# Patient Record
Sex: Female | Born: 1995 | Race: White | Hispanic: No | Marital: Married | Smoking: Never smoker
Health system: Southern US, Community
[De-identification: ages and names within clinical notes are randomized; demographics above are authoritative.]

## PROBLEM LIST (undated history)

## (undated) DIAGNOSIS — K8020 Calculus of gallbladder without cholecystitis without obstruction: Secondary | ICD-10-CM

## (undated) DIAGNOSIS — I10 Essential (primary) hypertension: Secondary | ICD-10-CM

## (undated) DIAGNOSIS — O139 Gestational [pregnancy-induced] hypertension without significant proteinuria, unspecified trimester: Secondary | ICD-10-CM

## (undated) HISTORY — PX: NO PAST SURGERIES: SHX2092

## (undated) HISTORY — DX: Calculus of gallbladder without cholecystitis without obstruction: K80.20

---
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
## 2019-09-05 ENCOUNTER — Other Ambulatory Visit: Payer: Self-pay

## 2019-09-05 ENCOUNTER — Ambulatory Visit (INDEPENDENT_AMBULATORY_CARE_PROVIDER_SITE_OTHER): Payer: Medicaid Other

## 2019-09-05 VITALS — BP 127/85 | HR 97 | Ht 63.5 in | Wt 179.0 lb

## 2019-09-05 DIAGNOSIS — N912 Amenorrhea, unspecified: Secondary | ICD-10-CM

## 2019-09-05 DIAGNOSIS — Z3491 Encounter for supervision of normal pregnancy, unspecified, first trimester: Secondary | ICD-10-CM

## 2019-09-05 DIAGNOSIS — Z3687 Encounter for antenatal screening for uncertain dates: Secondary | ICD-10-CM

## 2019-09-05 DIAGNOSIS — O3680X0 Pregnancy with inconclusive fetal viability, not applicable or unspecified: Secondary | ICD-10-CM

## 2019-09-05 DIAGNOSIS — Z3403 Encounter for supervision of normal first pregnancy, third trimester: Secondary | ICD-10-CM | POA: Insufficient documentation

## 2019-09-05 LAB — POCT URINE PREGNANCY: Preg Test, Ur: POSITIVE — AB

## 2019-09-05 MED ORDER — VITAFOL GUMMIES 3.33-0.333-34.8 MG PO CHEW: 3.0000 | CHEWABLE_TABLET | Freq: Every day | ORAL | 11 refills | Status: DC

## 2019-09-05 MED ORDER — BLOOD PRESSURE MONITOR KIT: 1.0000 | PACK | 0 refills | Status: DC

## 2019-09-05 NOTE — Progress Notes (Signed)
Patient was assessed and managed by nursing staff during this encounter. I have reviewed the chart and agree with the documentation and plan. I have also made any necessary editorial changes.  Griffin Basil, MD 09/05/2019 3:27 PM

## 2019-09-05 NOTE — Progress Notes (Signed)
.   PRENATAL INTAKE SUMMARY  Ms. Rudi Rummage presents today New OB Nurse Interview.  OB History   No obstetric history on file.    I have reviewed the patient's medical, obstetrical, social, and family histories, medications, and available lab results.  SUBJECTIVE She has no unusual complaints  OBJECTIVE Initial Physical Exam (New OB)  GENERAL APPEARANCE: alert, well appearing   ASSESSMENT Normal pregnancy  PLAN Prenatal care New OB labs will be completed at NEW OB visit Baby Scripts Ordered Blood Pressure Kit Ordered PHQ9 score: 2  DATING AND VIABILITY SONOGRAM   Ariadne Rissmiller is a 24 y.o. year old G1P0 with LMP Patient's last menstrual period was 06/27/2019. which would correlate to  51w4dweeks gestation.  She has regular menstrual cycles.   She is here today for a confirmatory initial sonogram.    GESTATION: 139w4dINGLETON     FETAL ACTIVITY:           Heart rate         171          The fetus is active.  Gestational criteria: Estimated Date of Delivery: 03/29/20 by early ultrasound now at 1017w4drevious Scans:0      CROWN RUMP LENGTH              10w22w4d                                                                           AVERAGE EGA(BY THIS SCAN):  10w474w4dKING EDD( early ultrasound ):  03/29/20     TECHNICIAN COMMENTS:  Single Live IUP at 31w4d14w4dL. [id number]   A copy of this report including all images has been saved and backed up to a second source for retrieval if needed. All measures and details of the anatomical scan, placentation, fluid volume and pelvic anatomy are contained in that report.  Olyn Landstrom Lucianne Lei021 2:38 PM

## 2019-09-13 ENCOUNTER — Other Ambulatory Visit: Payer: Self-pay

## 2019-09-13 ENCOUNTER — Ambulatory Visit (INDEPENDENT_AMBULATORY_CARE_PROVIDER_SITE_OTHER): Payer: Medicaid Other | Admitting: Obstetrics and Gynecology

## 2019-09-13 ENCOUNTER — Encounter: Payer: Self-pay | Admitting: Obstetrics and Gynecology

## 2019-09-13 ENCOUNTER — Other Ambulatory Visit (HOSPITAL_COMMUNITY)
Admission: RE | Admit: 2019-09-13 | Discharge: 2019-09-13 | Disposition: A | Discharge status: Home / Self Care | Payer: Medicaid Other | Source: Ambulatory Visit | Attending: Obstetrics and Gynecology | Admitting: Obstetrics and Gynecology

## 2019-09-13 VITALS — BP 119/78 | HR 95 | Wt 176.6 lb

## 2019-09-13 DIAGNOSIS — Z124 Encounter for screening for malignant neoplasm of cervix: Secondary | ICD-10-CM

## 2019-09-13 DIAGNOSIS — Z3481 Encounter for supervision of other normal pregnancy, first trimester: Secondary | ICD-10-CM

## 2019-09-13 DIAGNOSIS — Z3491 Encounter for supervision of normal pregnancy, unspecified, first trimester: Secondary | ICD-10-CM

## 2019-09-13 DIAGNOSIS — Z7189 Other specified counseling: Secondary | ICD-10-CM

## 2019-09-13 DIAGNOSIS — Z3A11 11 weeks gestation of pregnancy: Secondary | ICD-10-CM

## 2019-09-13 NOTE — Patient Instructions (Signed)

## 2019-09-13 NOTE — Progress Notes (Signed)
Pt is here for initial OB visit. EDD 03/29/20.

## 2019-09-13 NOTE — Progress Notes (Signed)
  Subjective:    Catherine Paul is a G1P0 [redacted]w[redacted]d being seen today for her first obstetrical visit.  Her obstetrical history is unremarkable. Patient does intend to breast feed. Pregnancy history fully reviewed.  Patient reports no complaints.Feeling well. Just a little tired. FOB involved. Lives in [place]. Tries to eat well.    Vitals:   09/13/19 1314 09/13/19 1320  BP: 136/84 119/78  Pulse: 94 95  Weight: 176 lb 9.6 oz (80.1 kg)     HISTORY: OB History  Gravida Para Term Preterm AB Living  1            SAB TAB Ectopic Multiple Live Births               # Outcome Date GA Lbr Len/2nd Weight Sex Delivery Anes PTL Lv  1 Current            History reviewed. No pertinent past medical history. History reviewed. No pertinent surgical history. Family History  Problem Relation Age of Onset  . Diabetes Maternal Grandmother   . Hypertension Maternal Grandmother   . Hypertension Maternal Grandfather   . Stroke Maternal Grandfather   . Hypertension Paternal Grandmother      Exam    Uterus:     Pelvic Exam:    Perineum: No Hemorrhoids   Vulva: normal   Vagina:  normal mucosa   pH: n/a   Cervix: nulliparous appearance   Adnexa: normal adnexa      System: Breast:  normal appearance, no masses or tenderness   Skin: normal coloration and turgor, no rashes    Neurologic: normal   Extremities: normal strength, tone, and muscle mass, no deformities   HEENT PERRLA and sclera clear, anicteric   Mouth/Teeth mucous membranes moist, pharynx normal without lesions   Neck supple   Cardiovascular: regular rate and rhythm   Respiratory:  appears well, vitals normal, no respiratory distress, acyanotic, normal RR, ear and throat exam is normal, neck free of mass or lymphadenopathy, chest clear, no wheezing, crepitations, rhonchi, normal symmetric air entry   Abdomen: soft, non-tender; bowel sounds normal; no masses,  no organomegaly   Urinary: urethral meatus normal      Assessment:     Pregnancy: G1P0 Patient Active Problem List   Diagnosis Date Noted  . Encounter for supervision of normal pregnancy, unspecified, first trimester 09/05/2019        Plan:    Supervision of Normal Pregnancy  Initial labs drawn. Pap performed. Prenatal vitamins. Problem list reviewed and updated. Genetic Screening discussed: requested.   Follow up in 4 weeks.  The patient was counseled on the potential benefits and lack of known risks of COVID vaccination, during pregnancy and breastfeeding, on today's visit. The patient's questions and concerns were addressed today including concerns over lack of data on first trimester vaccination. The patient is still unsure of her decision for vaccination.      Janet Berlin 09/13/2019

## 2019-09-14 LAB — CBC/D/PLT+RPR+RH+ABO+RUB AB...
Antibody Screen: NEGATIVE
Basophils Absolute: 0 10*3/uL (ref 0.0–0.2)
Basos: 1 %
EOS (ABSOLUTE): 0.1 10*3/uL (ref 0.0–0.4)
Eos: 1 %
HCV Ab: <0.1 s/co ratio (ref 0.0–0.9)
HIV Screen 4th Generation wRfx: NONREACTIVE
Hematocrit: 41.9 % (ref 34.0–46.6)
Hemoglobin: 14.5 g/dL (ref 11.1–15.9)
Hepatitis B Surface Ag: NEGATIVE
Immature Grans (Abs): 0 10*3/uL (ref 0.0–0.1)
Immature Granulocytes: 0 %
Lymphocytes Absolute: 1.8 10*3/uL (ref 0.7–3.1)
Lymphs: 20 %
MCH: 30 pg (ref 26.6–33.0)
MCHC: 34.6 g/dL (ref 31.5–35.7)
MCV: 87 fL (ref 79–97)
Monocytes Absolute: 0.5 10*3/uL (ref 0.1–0.9)
Monocytes: 6 %
Neutrophils Absolute: 6.2 10*3/uL (ref 1.4–7.0)
Neutrophils: 72 %
Platelets: 263 10*3/uL (ref 150–450)
RBC: 4.84 x10E6/uL (ref 3.77–5.28)
RDW: 13.1 % (ref 11.7–15.4)
RPR Ser Ql: NONREACTIVE
Rh Factor: POSITIVE
Rubella Antibodies, IGG: 8.46 index (ref >0.99)
WBC: 8.6 10*3/uL (ref 3.4–10.8)

## 2019-09-14 LAB — HCV INTERPRETATION

## 2019-09-14 LAB — CERVICOVAGINAL ANCILLARY ONLY
Bacterial Vaginitis (gardnerella): NEGATIVE
Candida Glabrata: NEGATIVE
Candida Vaginitis: NEGATIVE
Chlamydia: NEGATIVE
Comment: NEGATIVE
Comment: NEGATIVE
Comment: NEGATIVE
Comment: NEGATIVE
Comment: NEGATIVE
Comment: NORMAL
Neisseria Gonorrhea: NEGATIVE
Trichomonas: NEGATIVE

## 2019-09-14 LAB — HEMOGLOBIN A1C
Est. average glucose Bld gHb Est-mCnc: 105 mg/dL
Hgb A1c MFr Bld: 5.3 % (ref 4.8–5.6)

## 2019-09-14 LAB — CYTOLOGY - PAP: Diagnosis: NEGATIVE

## 2019-09-15 LAB — CULTURE, OB URINE

## 2019-09-15 LAB — URINE CULTURE, OB REFLEX

## 2019-09-19 ENCOUNTER — Encounter: Payer: Self-pay | Admitting: Obstetrics and Gynecology

## 2019-09-21 ENCOUNTER — Encounter: Payer: Self-pay | Admitting: Obstetrics and Gynecology

## 2019-10-11 ENCOUNTER — Ambulatory Visit (INDEPENDENT_AMBULATORY_CARE_PROVIDER_SITE_OTHER): Payer: Medicaid Other

## 2019-10-11 ENCOUNTER — Other Ambulatory Visit: Payer: Self-pay

## 2019-10-11 VITALS — BP 119/82 | HR 114 | Wt 174.2 lb

## 2019-10-11 DIAGNOSIS — O99891 Other specified diseases and conditions complicating pregnancy: Secondary | ICD-10-CM

## 2019-10-11 DIAGNOSIS — Z3A15 15 weeks gestation of pregnancy: Secondary | ICD-10-CM

## 2019-10-11 DIAGNOSIS — O26892 Other specified pregnancy related conditions, second trimester: Secondary | ICD-10-CM | POA: Diagnosis not present

## 2019-10-11 DIAGNOSIS — R300 Dysuria: Secondary | ICD-10-CM

## 2019-10-11 DIAGNOSIS — Z3481 Encounter for supervision of other normal pregnancy, first trimester: Secondary | ICD-10-CM

## 2019-10-11 DIAGNOSIS — M549 Dorsalgia, unspecified: Secondary | ICD-10-CM

## 2019-10-11 LAB — POCT URINALYSIS DIPSTICK
Bilirubin, UA: NEGATIVE
Blood, UA: NEGATIVE
Glucose, UA: NEGATIVE
Ketones, UA: NEGATIVE
Nitrite, UA: NEGATIVE
Protein, UA: NEGATIVE
Spec Grav, UA: 1.01 (ref 1.010–1.025)
Urobilinogen, UA: 0.2 E.U./dL
pH, UA: 6 (ref 5.0–8.0)

## 2019-10-11 MED ORDER — MISC. DEVICES MISC: 0 refills | Status: DC

## 2019-10-11 NOTE — Progress Notes (Addendum)
Pt needs maternity belt

## 2019-10-11 NOTE — Patient Instructions (Signed)
Back Pain in Pregnancy Back pain during pregnancy is common. Back pain may be caused by several factors that are related to changes during your pregnancy. Follow these instructions at home: Managing pain, stiffness, and swelling      If directed, for sudden (acute) back pain, put ice on the painful area. ? Put ice in a plastic bag. ? Place a towel between your skin and the bag. ? Leave the ice on for 20 minutes, 2-3 times per day.  If directed, apply heat to the affected area before you exercise. Use the heat source that your health care provider recommends, such as a moist heat pack or a heating pad. ? Place a towel between your skin and the heat source. ? Leave the heat on for 20-30 minutes. ? Remove the heat if your skin turns bright red. This is especially important if you are unable to feel pain, heat, or cold. You may have a greater risk of getting burned.  If directed, massage the affected area. Activity  Exercise as told by your health care provider. Gentle exercise is the best way to prevent or manage back pain.  Listen to your body when lifting. If lifting hurts, ask for help or bend your knees. This uses your leg muscles instead of your back muscles.  Squat down when picking up something from the floor. Do not bend over.  Only use bed rest for short periods as told by your health care provider. Bed rest should only be used for the most severe episodes of back pain. Standing, sitting, and lying down  Do not stand in one place for long periods of time.  Use good posture when sitting. Make sure your head rests over your shoulders and is not hanging forward. Use a pillow on your lower back if necessary.  Try sleeping on your side, preferably the left side, with a pregnancy support pillow or 1-2 regular pillows between your legs. ? If you have back pain after a night's rest, your bed may be too soft. ? A firm mattress may provide more support for your back during  pregnancy. General instructions  Do not wear high heels.  Eat a healthy diet. Try to gain weight within your health care provider's recommendations.  Use a maternity girdle, elastic sling, or back brace as told by your health care provider.  Take over-the-counter and prescription medicines only as told by your health care provider.  Work with a physical therapist or massage therapist to find ways to manage back pain. Acupuncture or massage therapy may be helpful.  Keep all follow-up visits as told by your health care provider. This is important. Contact a health care provider if:  Your back pain interferes with your daily activities.  You have increasing pain in other parts of your body. Get help right away if:  You develop numbness, tingling, weakness, or problems with the use of your arms or legs.  You develop severe back pain that is not controlled with medicine.  You have a change in bowel or bladder control.  You develop shortness of breath, dizziness, or you faint.  You develop nausea, vomiting, or sweating.  You have back pain that is a rhythmic, cramping pain similar to labor pains. Labor pain is usually 1-2 minutes apart, lasts for about 1 minute, and involves a bearing down feeling or pressure in your pelvis.  You have back pain and your water breaks or you have vaginal bleeding.  You have back pain or numbness  that travels down your leg. °· Your back pain developed after you fell. °· You develop pain on one side of your back. °· You see blood in your urine. °· You develop skin blisters in the area of your back pain. °Summary °· Back pain may be caused by several factors that are related to changes during your pregnancy. °· Follow instructions as told by your health care provider for managing pain, stiffness, and swelling. °· Exercise as told by your health care provider. Gentle exercise is the best way to prevent or manage back pain. °· Take over-the-counter and  prescription medicines only as told by your health care provider. °· Keep all follow-up visits as told by your health care provider. This is important. °This information is not intended to replace advice given to you by your health care provider. Make sure you discuss any questions you have with your health care provider. °Document Revised: 05/09/2018 Document Reviewed: 07/06/2017 °Elsevier Patient Education © 2020 [employer] ° °

## 2019-10-11 NOTE — Progress Notes (Signed)
LOW-RISK PREGNANCY OFFICE VISIT  Patient name: Nisreen Guise MRN [id number]  Date of birth: [birth year] Chief Complaint:   Routine Prenatal Visit  Subjective:   Fantasy Donald is a 24 y.o. G1P0 female at [redacted]w[redacted]d with an Estimated Date of Delivery: 03/29/20 being seen today for ongoing management of a low-risk pregnancy aeb has Encounter for supervision of normal pregnancy, unspecified, first trimester on their problem list.  Patient presents today with complaint of hip and back pain. Patient states pain started Friday after work despite not taking on any abnormal activities.  Patient states each day the pain has gotten progressively better and now is more aching.  She states she is not taking any medication for her symptoms and "doesn't really like to take medication anyway." Patient states she has not done any warm or cold compresses, but finds warm showers help.  Patient denies vaginal concerns including abnormal discharge, leaking of fluid, and bleeding. However, patient reports burning with urination on Monday and nothing since.  She reports drinking 64 ounces of water daily.  Contractions: Not present. Vag. Bleeding: None.  Movement: Present.  Reviewed past medical,surgical, social, obstetrical and family history as well as problem list, medications and allergies.  Objective   Vitals:   10/11/19 1033  BP: 119/82  Pulse: (!) 114  Weight: 174 lb 3.2 oz (79 kg)  Body mass index is 30.37 kg/m.  Total Weight Gain:3.2 oz (0.091 kg)         Physical Examination:   General appearance: Well appearing, and in no distress  Mental status: Alert, oriented to person, place, and time  Skin: Warm & dry  Cardiovascular: Normal heart rate noted  Respiratory: Normal respiratory effort, no distress  Abdomen: Soft, gravid, nontender, AGA   Pelvic: Cervical exam deferred           Extremities: Edema: None  Fetal Status: Fetal Heart Rate (bpm): 160  Movement: Present   Results for orders placed or  performed in visit on 10/11/19 (from the past 24 hour(s))  POCT Urinalysis Dipstick   Collection Time: 10/11/19 11:12 AM  Result Value Ref Range   Color, UA Yellow    Clarity, UA slightly cloudy    Glucose, UA Negative Negative   Bilirubin, UA Negative    Ketones, UA Negative    Spec Grav, UA 1.010 1.010 - 1.025   Blood, UA Negative    pH, UA 6.0 5.0 - 8.0   Protein, UA Negative Negative   Urobilinogen, UA 0.2 0.2 or 1.0 E.U./dL   Nitrite, UA Negative    Leukocytes, UA Small (1+) (A) Negative   Appearance     Odor      Assessment & Plan:  Low-risk pregnancy of a 24 y.o., G1P0 at [redacted]w[redacted]d with an Estimated Date of Delivery: 03/29/20   1. [redacted] weeks gestation of pregnancy -AFP today  2. Encounter for supervision of other normal pregnancy in first trimester -Anticipatory guidance for upcoming appts. -Plan for VV in 4-6 weeks.  -Encouraged to increase H2O intake to about 80ounces daily.   3. Back pain affecting pregnancy in second trimester -Discussed usage of tylenol and/or warm compresses for back pain. -Reviewed initiation of belly support band/belt for usage at work. -Rx given.  4. Dysuria during pregnancy in second trimester -Informed of negative urine dip, less + leuks. -Will send for culture.  -Encouraged to monitor symptoms and report as appropriate.      Meds: No orders of the defined types were placed in this encounter.  Labs/procedures today:   Lab Orders     Urine Culture     AFP, Serum, Open Spina Bifida     POCT Urinalysis Dipstick   Reviewed: Preterm labor symptoms and general obstetric precautions including but not limited to vaginal bleeding, contractions, leaking of fluid and fetal movement were reviewed in detail with the patient.  All questions were answered.  Follow-up: Return in about 5 weeks (around 11/15/2019) for Virtual LR-ROB.  Orders Placed This Encounter  Procedures  . Urine Culture  . AFP, Serum, Open Spina Bifida  . POCT Urinalysis  Dipstick   Maryann Conners MSN, CNM 10/11/2019

## 2019-10-11 NOTE — Progress Notes (Signed)
Patient presents for ROB. Patient complains of having hip and lower back pain. She also complains of having intermittent burning with urination.

## 2019-10-13 LAB — AFP, SERUM, OPEN SPINA BIFIDA
AFP MoM: 1.45
AFP Value: 39.7 ng/mL
Gest. Age on Collection Date: 15 weeks
Maternal Age At EDD: 23.3 yr
OSBR Risk 1 IN: 3133
Test Results:: NEGATIVE
Weight: 174 [lb_av]

## 2019-10-13 LAB — URINE CULTURE: Organism ID, Bacteria: NO GROWTH

## 2019-10-16 ENCOUNTER — Telehealth: Payer: Self-pay

## 2019-10-16 ENCOUNTER — Emergency Department (HOSPITAL_BASED_OUTPATIENT_CLINIC_OR_DEPARTMENT_OTHER)
Admission: EM | Admit: 2019-10-16 | Discharge: 2019-10-16 | Disposition: A | Discharge status: Home / Self Care | Payer: Medicaid Other | Attending: Emergency Medicine | Admitting: Emergency Medicine

## 2019-10-16 ENCOUNTER — Other Ambulatory Visit: Payer: Self-pay

## 2019-10-16 ENCOUNTER — Encounter (HOSPITAL_BASED_OUTPATIENT_CLINIC_OR_DEPARTMENT_OTHER): Payer: Self-pay

## 2019-10-16 DIAGNOSIS — Z3A16 16 weeks gestation of pregnancy: Secondary | ICD-10-CM | POA: Insufficient documentation

## 2019-10-16 DIAGNOSIS — O98512 Other viral diseases complicating pregnancy, second trimester: Secondary | ICD-10-CM | POA: Diagnosis not present

## 2019-10-16 DIAGNOSIS — Z3492 Encounter for supervision of normal pregnancy, unspecified, second trimester: Secondary | ICD-10-CM

## 2019-10-16 DIAGNOSIS — Z79899 Other long term (current) drug therapy: Secondary | ICD-10-CM | POA: Diagnosis not present

## 2019-10-16 DIAGNOSIS — M7918 Myalgia, other site: Secondary | ICD-10-CM | POA: Diagnosis not present

## 2019-10-16 DIAGNOSIS — U071 COVID-19: Secondary | ICD-10-CM | POA: Diagnosis not present

## 2019-10-16 LAB — SARS CORONAVIRUS 2 BY RT PCR (HOSPITAL ORDER, PERFORMED IN ~~LOC~~ HOSPITAL LAB): SARS Coronavirus 2: POSITIVE — AB

## 2019-10-16 MED ORDER — ACETAMINOPHEN 325 MG PO TABS
ORAL_TABLET | ORAL | Status: AC
  Filled 2019-10-16: qty 2

## 2019-10-16 MED ORDER — ACETAMINOPHEN 325 MG PO TABS
650.0000 mg | ORAL_TABLET | Freq: Once | ORAL | Status: AC
  Administered 2019-10-16: 650 mg via ORAL

## 2019-10-16 NOTE — ED Notes (Signed)
ED Provider at bedside. 

## 2019-10-16 NOTE — Discharge Instructions (Addendum)
Home to rest. Follow up with your OB.  Tylenol and Mucenx as needed as directed for fever and symptom management.  Discuss antibody infusion with your OB, THEN call [phone] to discuss scheduling with the infusion clinic.

## 2019-10-16 NOTE — ED Provider Notes (Signed)
[facility] EMERGENCY DEPARTMENT Provider Note   CSN: [id number] Arrival date & time: 10/16/19  1344     History Chief Complaint  Patient presents with  . Cough    [redacted]weeks pregnant    Catherine Paul is a 24 y.o. female.  Catherine Paul is a 24 year old female with no significant PMH who is [redacted] weeks pregnant who reports with a chief complaint of flu like symptoms. On Friday (10/12/19) she started feeling unwell with fever, chills, nasal congestion, sore throat, and productive cough. She reports feeling very fatigued and is laying around a lot. She has been taking Mucinex for her symptoms. She endorses SOB and chest pain when taking a deep breath because breathing deeply causes her to have a coughing fit. She also says being active and walking has been more difficult since her symptoms started. She denies SOB and chest pain at rest. This is the patient's first pregnancy and she was evaluated by OB on 10/11/18 and her pregnancy is uncomplicated at this time. Patient has not had abdominal pain or vaginal bleeding since symptoms started. She is not vaccinated for covid-19.        History reviewed. No pertinent past medical history.  Patient Active Problem List   Diagnosis Date Noted  . Encounter for supervision of normal pregnancy, unspecified, first trimester 09/05/2019    History reviewed. No pertinent surgical history.   OB History    Gravida  1   Para      Term      Preterm      AB      Living        SAB      TAB      Ectopic      Multiple      Live Births              Family History  Problem Relation Age of Onset  . Diabetes Maternal Grandmother   . Hypertension Maternal Grandmother   . Hypertension Maternal Grandfather   . Stroke Maternal Grandfather   . Hypertension Paternal Grandmother     Social History   Tobacco Use  . Smoking status: Never Smoker  . Smokeless tobacco: Never Used  Vaping Use  . Vaping Use: Never used  Substance  Use Topics  . Alcohol use: Not Currently  . Drug use: Never    Home Medications Prior to Admission medications   Medication Sig Start Date End Date Taking? Authorizing Provider  Blood Pressure Monitor KIT 1 kit by Does not apply route once a week. 09/05/19   Griffin Basil, MD  Misc. Devices MISC Dispense one maternity belt for patient 10/11/19   Gavin Pound, CNM  Prenatal Vit-Fe Phos-FA-Omega (VITAFOL GUMMIES) 3.33-0.333-34.8 MG CHEW Chew 3 tablets by mouth daily. 09/05/19   Griffin Basil, MD    Allergies    Patient has no known allergies.  Review of Systems   Review of Systems  Constitutional: Positive for chills, fatigue and fever.  HENT: Positive for congestion and sore throat.   Respiratory: Positive for cough and shortness of breath.        SHOB with deep inspiration   Gastrointestinal: Negative for abdominal pain, diarrhea, nausea and vomiting.  Genitourinary: Negative for vaginal bleeding and vaginal discharge.  Musculoskeletal: Positive for arthralgias and myalgias.  Skin: Negative for rash.  Allergic/Immunologic: Positive for immunocompromised state.  Neurological: Negative for weakness.  All other systems reviewed and are negative.   Physical Exam  Updated Vital Signs BP 121/82 (BP Location: Right Arm)   Pulse (!) 124   Temp 99.4 F (37.4 C) (Oral)   Resp 20   LMP 06/27/2019   SpO2 99%   Physical Exam Vitals and nursing note reviewed.  Constitutional:      General: She is not in acute distress.    Appearance: She is well-developed. She is not diaphoretic.  HENT:     Head: Normocephalic and atraumatic.  Cardiovascular:     Rate and Rhythm: Regular rhythm. Tachycardia present.     Pulses: Normal pulses.     Heart sounds: Normal heart sounds. No murmur heard.   Pulmonary:     Effort: Pulmonary effort is normal.     Breath sounds: Normal breath sounds.  Abdominal:     Palpations: Abdomen is soft.     Tenderness: There is no abdominal tenderness.      Comments: Gravid   Musculoskeletal:     Right lower leg: No edema.     Left lower leg: No edema.  Skin:    General: Skin is warm and dry.     Findings: No erythema or rash.  Neurological:     Mental Status: She is alert and oriented to person, place, and time.  Psychiatric:        Behavior: Behavior normal.     ED Results / Procedures / Treatments   Labs (all labs ordered are listed, but only abnormal results are displayed) Labs Reviewed  SARS CORONAVIRUS 2 BY RT PCR (HOSPITAL ORDER, [facility]) - Abnormal; Notable for the following components:      Result Value   SARS Coronavirus 2 POSITIVE (*)    All other components within normal limits    EKG None  Radiology No results found.  Procedures Procedures (including critical care time)  Medications Ordered in ED Medications  acetaminophen (TYLENOL) 325 MG tablet (has no administration in time range)  acetaminophen (TYLENOL) tablet 650 mg (650 mg Oral Given 10/16/19 1353)    ED Course  I have reviewed the triage vital signs and the nursing notes.  Pertinent labs & imaging results that were available during my care of the patient were reviewed by me and considered in my medical decision making (see chart for details).  Clinical Course as of Oct 15 1699  Tue Oct 16, 4739  8557 24 year old female, [redacted] weeks pregnant, with symptoms as above. Well appearing on exam, lungs clear, mildly tachycardia. Febrile in triage, improved with Tylenol in triage. + FHT. Found to be COVID + today with onset of symptoms 4 days ago. Unvaccinated, planned to vaccinate in her 2nd trimester.  Discussed with infusion clinic, patient does qualify for an infusion. Patient to talk to her OB and then contact clinic to schedule infusion if OB approves.   Kasondra Junod was evaluated in Emergency Department on 10/16/2019 for the symptoms described in the history of present illness. She was evaluated in the context of the global  COVID-19 pandemic, which necessitated consideration that the patient might be at risk for infection with the SARS-CoV-2 virus that causes COVID-19. Institutional protocols and algorithms that pertain to the evaluation of patients at risk for COVID-19 are in a state of rapid change based on information released by regulatory bodies including the CDC and federal and state organizations. These policies and algorithms were followed during the patient's care in the ED.     [LM]    Clinical Course User Index [  LM] Roque Lias   MDM Rules/Calculators/A&P                          Final Clinical Impression(s) / ED Diagnoses Final diagnoses:  COVID-19  Pregnant and not yet delivered in second trimester    Rx / DC Orders ED Discharge Orders    None       Tacy Learn, PA-C 10/16/19 1701    Charlesetta Shanks, MD 10/17/19 1729

## 2019-10-16 NOTE — ED Notes (Signed)
[redacted] weeks gestation, G1P0, has been having PreNatal Visit, cough onset in the past few days, sore throat, taking deep breaths makes chest pain present. First noted symptoms approx a week ago, placed on cont POX monitoring and int nbp assessments

## 2019-10-16 NOTE — ED Triage Notes (Addendum)
Pt c/o flu like sx started 8/10-pt state she is [redacted] weeks pregnant-NAD-steady gait

## 2019-10-16 NOTE — Telephone Encounter (Signed)
Reviewed patient chart she has been admitted to the ER for covid.

## 2019-10-17 ENCOUNTER — Telehealth: Payer: Self-pay | Admitting: Physician Assistant

## 2019-10-17 NOTE — Telephone Encounter (Signed)
Called to discuss with patient about Covid symptoms and the use of casirivimab/imdevimab, a monoclonal antibody infusion for those with mild to moderate Covid symptoms and at a high risk of hospitalization.  Pt is qualified for this infusion at the [facility] due to; Specific high risk criteria : Pregnancy   Message left to call back our hotline [phone] and sent mychart message.  Angelena Form PA-C  [facility]

## 2019-10-19 ENCOUNTER — Other Ambulatory Visit: Payer: Self-pay | Admitting: Obstetrics and Gynecology

## 2019-11-05 ENCOUNTER — Ambulatory Visit: Payer: Medicaid Other | Attending: Obstetrics and Gynecology

## 2019-11-05 ENCOUNTER — Other Ambulatory Visit: Payer: Self-pay

## 2019-11-05 DIAGNOSIS — Z3491 Encounter for supervision of normal pregnancy, unspecified, first trimester: Secondary | ICD-10-CM | POA: Insufficient documentation

## 2019-11-15 ENCOUNTER — Telehealth (INDEPENDENT_AMBULATORY_CARE_PROVIDER_SITE_OTHER): Payer: Medicaid Other

## 2019-11-15 VITALS — BP 123/81 | HR 93

## 2019-11-15 DIAGNOSIS — Z3481 Encounter for supervision of other normal pregnancy, first trimester: Secondary | ICD-10-CM

## 2019-11-15 DIAGNOSIS — M5430 Sciatica, unspecified side: Secondary | ICD-10-CM | POA: Insufficient documentation

## 2019-11-15 DIAGNOSIS — O26892 Other specified pregnancy related conditions, second trimester: Secondary | ICD-10-CM

## 2019-11-15 DIAGNOSIS — Z3A20 20 weeks gestation of pregnancy: Secondary | ICD-10-CM

## 2019-11-15 HISTORY — DX: Sciatica, unspecified side: M54.30

## 2019-11-15 NOTE — Patient Instructions (Addendum)
COVID-19 Vaccine Information can be found at: [email or web address] For questions related to vaccine distribution or appointments, please email [email or web address] or call [phone].    

## 2019-11-15 NOTE — Progress Notes (Signed)
I connected with Catherine Paul on 11/15/19 at  4:00 PM EDT by telephone and verified that I am speaking with the correct person using two identifiers.   Pt presents for ROB without complaints today.

## 2019-11-15 NOTE — Progress Notes (Signed)
OBSTETRICS PRENATAL VIRTUAL VISIT ENCOUNTER NOTE  Provider location: [facility] [place]   I connected with Jerelene Redden on 11/15/19 at  4:00 PM EDT by MyChart Video Encounter at home and verified that I am speaking with the correct person using two identifiers.   I discussed the limitations, risks, security and privacy concerns of performing an evaluation and management service virtually and the availability of in person appointments. I also discussed with the patient that there may be a patient responsible charge related to this service. The patient expressed understanding and agreed to proceed. Subjective:  Catherine Paul is a 24 y.o. G1P0 at [redacted]w[redacted]d being seen today for ongoing prenatal care.  She is currently monitored for the following issues for this low-risk pregnancy and has Encounter for supervision of normal pregnancy, unspecified, first trimester on their problem list.  Patient reports no complaints. She endorses fetal movement and denies abdominal cramping or contractions.  However, she reports some "pulling" in the umbilicus and in the pelvic area that occurred from sitting to standing that lasted about 15 minutes.  She reports continued hip pain that radiates to her legs. She reports the belly support band has improved this.  Contractions: Not present. Vag. Bleeding: None.  Movement: Present. Denies any leaking of fluid.   The following portions of the patient's history were reviewed and updated as appropriate: allergies, current medications, past family history, past medical history, past social history, past surgical history and problem list.   Objective:   Vitals:   11/15/19 1608  BP: 123/81  Pulse: 93    Fetal Status:     Movement: Present     General:  Alert, oriented and cooperative. Patient is in no acute distress.  Respiratory: Normal respiratory effort, no problems with respiration noted  Mental Status: Normal mood and affect. Normal behavior.  Normal judgment and thought content.  Rest of physical exam deferred due to type of encounter  Imaging: [place] MFM OB DETAIL +14 WK  Result Date: 11/05/2019 ----------------------------------------------------------------------  OBSTETRICS REPORT                       (Signed Final 11/05/2019 11:03 am) ---------------------------------------------------------------------- Patient Info  ID #:       [id number]                          D.O.B.:  10/05/1995 (24 yrs)  Name:       Jerelene Redden                    Visit Date: 11/05/2019 09:55 am ---------------------------------------------------------------------- Performed By  Attending:        Tama High MD        Ref. Address:     [facility]  Performed By:     Rodrigo Ran BS      [facility]                                 [facility] at                                                             [place] [facility]                                                             [facility]  Referred By:      Griffin Basil MD ---------------------------------------------------------------------- Orders  #  Description                           Code        Ordered By  1  [place] MFM OB DETAIL +14 WK               76811.01    JULIA MARSALA ----------------------------------------------------------------------  #  Order #                     Accession #                Episode #  1  [id number]                   [id number]                 [id number] ---------------------------------------------------------------------- Indications  Antenatal screening for malformations (low     Z36.3  risk NIPS, neg AFP/Horizon 14  Obesity complicating pregnancy, second         O99.212  trimester (pregravid BMI 30.3)  [redacted] weeks gestation of pregnancy                Z3A.19  ---------------------------------------------------------------------- Fetal Evaluation  Num Of Fetuses:         1  Cardiac Activity:       Observed  Presentation:           Cephalic  Placenta:               Anterior  P. Cord Insertion:      Visualized  Amniotic Fluid  AFI FV:      Within normal limits                              Largest Pocket(cm)                              4.8 ---------------------------------------------------------------------- Biometry  BPD:  43.5  mm     G. Age:  19w 1d         45  %    CI:        71.06   %    70 - 86                                                          FL/HC:      17.8   %    16.1 - 18.3  HC:      164.4  mm     G. Age:  19w 1d         37  %    HC/AC:      1.10        1.09 - 1.39  AC:      150.1  mm     G. Age:  20w 2d         77  %    FL/BPD:     67.1   %  FL:       29.2  mm     G. Age:  19w 0d         32  %    FL/AC:      19.5   %    20 - 24  HUM:      29.1  mm     G. Age:  19w 3d         19  %  CER:      19.4  mm     G. Age:  18w 6d         32  %  NFT:       4.3  mm  LV:        5.8  mm  CM:        4.3  mm  Est. FW:     302  gm    0 lb 11 oz      65  % ---------------------------------------------------------------------- OB History  Gravidity:    1         Term:   0        Prem:   0        SAB:   0  TOP:          0       Ectopic:  0        Living: 0 ---------------------------------------------------------------------- Gestational Age  LMP:           18w 5d        Date:  06/27/19                 EDD:   04/02/20  U/S Today:     19w 3d                                        EDD:   03/28/20  Best:          19w 2d     Det. By:  U/S C R L  (09/05/19)    EDD:   03/29/20 ---------------------------------------------------------------------- Anatomy  Cranium:  Appears normal         Aortic Arch:            Appears normal  Cavum:                 Appears normal         Ductal Arch:            Appears normal  Ventricles:            Appears normal          Diaphragm:              Appears normal  Choroid Plexus:        Appears normal         Stomach:                Appears normal, left                                                                        sided  Cerebellum:            Appears normal         Abdomen:                Appears normal  Posterior Fossa:       Appears normal         Abdominal Wall:         Appears nml (cord                                                                        insert, abd wall)  Nuchal Fold:           Appears normal         Cord Vessels:           Appears normal (3                                                                        vessel cord)  Face:                  Appears normal         Kidneys:                Appear normal                         (orbits and profile)  Lips:                  Appears normal         Bladder:                Appears normal  Thoracic:  Appears normal         Spine:                  Appears normal  Heart:                 Appears normal         Upper Extremities:      Appears normal                         (4CH, axis, and                         situs)  RVOT:                  Appears normal         Lower Extremities:      Appears normal  LVOT:                  Appears normal  Other:  Heels/feet and RIGHT open hands/5th digits visualized. Nasal bone          visualized. ---------------------------------------------------------------------- Cervix Uterus Adnexa  Cervix  Length:           3.38  cm.  Normal appearance by transabdominal scan.  Uterus  No abnormality visualized.  Right Ovary  Not visualized.  Left Ovary  Not visualized.  Cul De Sac  No free fluid seen.  Adnexa  No abnormality visualized. ---------------------------------------------------------------------- Impression  G1 P0. Patient is here for fetal anatomy scan.  On cell-free fetal DNA screening, the risks of fetal  aneuploidies are not increased .MSAFP screening showed  low risk for open-neural tube defects .  She  reports no chronic medical conditions. Recently, she had  COVID-19 infection.  We performed fetal anatomy scan. No makers of  aneuploidies or fetal structural defects are seen. Fetal  biometry is consistent with her previously-established dates.  Amniotic fluid is normal and good fetal activity is seen.  Patient understands the limitations of ultrasound in detecting  fetal anomalies. ---------------------------------------------------------------------- Recommendations  Follow-up scans as clinically indicated. ----------------------------------------------------------------------                  Tama High, MD Electronically Signed Final Report   11/05/2019 11:03 am ----------------------------------------------------------------------   Assessment and Plan:  Pregnancy: G1P0 at [redacted]w[redacted]d 1. Encounter for supervision of other normal pregnancy in first trimester -Reviewed [place] results.  2. [redacted] weeks gestation of pregnancy -Doing well with exception of hip and sciatica pain. -Anticipatory guidance for upcoming appts.   3. Sciatica without back pain, unspecified laterality -Reviewed symptoms and informed that likely sciatica. -Reviewed comfort measures including stretching before and after bed, tylenol usage, maternity belt usage.  -Discussed referral to PT if symptoms worsen. -Informed that symptoms will resolve after delivery.   Preterm labor symptoms and general obstetric precautions including but not limited to vaginal bleeding, contractions, leaking of fluid and fetal movement were reviewed in detail with the patient. I discussed the assessment and treatment plan with the patient. The patient was provided an opportunity to ask questions and all were answered. The patient agreed with the plan and demonstrated an understanding of the instructions. The patient was advised to call back or seek an in-person office evaluation/go to MAU at [facility] for any urgent or concerning  symptoms. Please refer to After Visit Summary for other counseling recommendations.   I provided 18 minutes of  face-to-face time during this encounter.  Return in about 4 weeks (around 12/13/2019) for Virtual LR-ROB.  No future appointments.  Maryann Conners, [place] for [employer], [place]

## 2020-01-01 ENCOUNTER — Telehealth (INDEPENDENT_AMBULATORY_CARE_PROVIDER_SITE_OTHER): Payer: Medicaid Other | Admitting: Advanced Practice Midwife

## 2020-01-01 DIAGNOSIS — Z3481 Encounter for supervision of other normal pregnancy, first trimester: Secondary | ICD-10-CM

## 2020-01-01 DIAGNOSIS — O26892 Other specified pregnancy related conditions, second trimester: Secondary | ICD-10-CM | POA: Insufficient documentation

## 2020-01-01 DIAGNOSIS — R102 Pelvic and perineal pain: Secondary | ICD-10-CM

## 2020-01-01 DIAGNOSIS — Z3A27 27 weeks gestation of pregnancy: Secondary | ICD-10-CM

## 2020-01-01 MED ORDER — CYCLOBENZAPRINE HCL 5 MG PO TABS: 5.0000 mg | ORAL_TABLET | Freq: Three times a day (TID) | ORAL | 0 refills | Status: DC | PRN

## 2020-01-01 NOTE — Progress Notes (Signed)
Pt is having some problems with hip pain, has increased over last few weeks.  Pt states that it worse at work, she is on her feet most of day.

## 2020-01-01 NOTE — Progress Notes (Signed)
   OBSTETRICS PRENATAL VIRTUAL VISIT ENCOUNTER NOTE  Provider location: [facility] [place]   I connected with Jerelene Redden on 01/01/20 at  4:00 PM EST by MyChart Video Encounter at home and verified that I am speaking with the correct person using two identifiers.   I discussed the limitations, risks, security and privacy concerns of performing an evaluation and management service virtually and the availability of in person appointments. I also discussed with the patient that there may be a patient responsible charge related to this service. The patient expressed understanding and agreed to proceed. Subjective:  Catherine Paul is a 24 y.o. G1P0 at [redacted]w[redacted]d being seen today for ongoing prenatal care.  She is currently monitored for the following issues for this low-risk pregnancy and has Encounter for supervision of normal pregnancy, unspecified, first trimester and Sciatica without back pain on their problem list.  Patient reports pelvic and back pain.  Contractions: Not present. Vag. Bleeding: None.  Movement: Present. Denies any leaking of fluid.   The following portions of the patient's history were reviewed and updated as appropriate: allergies, current medications, past family history, past medical history, past social history, past surgical history and problem list.   Objective:  There were no vitals filed for this visit.  Fetal Status:     Movement: Present     General:  Alert, oriented and cooperative. Patient is in no acute distress.  Respiratory: Normal respiratory effort, no problems with respiration noted  Mental Status: Normal mood and affect. Normal behavior. Normal judgment and thought content.  Rest of physical exam deferred due to type of encounter  Imaging: No results found.  Assessment and Plan:  Pregnancy: G1P0 at [redacted]w[redacted]d 1. Pelvic pain affecting pregnancy in second trimester, antepartum --Pain a few weeks ago, so severe pt had trouble walking, pain at  low back and in right hip.  Improved with maternity support belt but has returned. Pt stands on her feet for work. --continue pregnancy support belt daily --Rx for Flexeril to take at night - Culture, OB Urine - Ambulatory referral to Physical Therapy - cyclobenzaprine (FLEXERIL) 5 MG tablet; Take 1-2 tablets (5-10 mg total) by mouth 3 (three) times daily as needed for muscle spasms.  Dispense: 30 tablet; Refill: 0  2. Encounter for supervision of other normal pregnancy in first trimester --Pt reports good fetal movement, denies cramping, LOF, or vaginal bleeding --Anticipatory guidance about next visits/weeks of pregnancy given. --next visit in 2 weeks in office for GTT   3. [redacted] weeks gestation of pregnancy  Preterm labor symptoms and general obstetric precautions including but not limited to vaginal bleeding, contractions, leaking of fluid and fetal movement were reviewed in detail with the patient. I discussed the assessment and treatment plan with the patient. The patient was provided an opportunity to ask questions and all were answered. The patient agreed with the plan and demonstrated an understanding of the instructions. The patient was advised to call back or seek an in-person office evaluation/go to MAU at [facility] for any urgent or concerning symptoms. Please refer to After Visit Summary for other counseling recommendations.   I provided 10 minutes of face-to-face time during this encounter.  No follow-ups on file.  No future appointments.  Fatima Blank, [place] for [employer], [place]

## 2020-01-15 ENCOUNTER — Other Ambulatory Visit: Payer: Medicaid Other

## 2020-01-15 ENCOUNTER — Other Ambulatory Visit: Payer: Self-pay

## 2020-01-15 ENCOUNTER — Ambulatory Visit (INDEPENDENT_AMBULATORY_CARE_PROVIDER_SITE_OTHER): Payer: Medicaid Other | Admitting: Advanced Practice Midwife

## 2020-01-15 VITALS — BP 129/84 | HR 117 | Wt 183.0 lb

## 2020-01-15 DIAGNOSIS — M5432 Sciatica, left side: Secondary | ICD-10-CM

## 2020-01-15 DIAGNOSIS — Z3A29 29 weeks gestation of pregnancy: Secondary | ICD-10-CM

## 2020-01-15 DIAGNOSIS — Z3481 Encounter for supervision of other normal pregnancy, first trimester: Secondary | ICD-10-CM

## 2020-01-15 NOTE — Progress Notes (Signed)
   PRENATAL VISIT NOTE  Subjective:  Catherine Paul is a 24 y.o. G1P0 at [redacted]w[redacted]d being seen today for ongoing prenatal care.  She is currently monitored for the following issues for this low-risk pregnancy and has Encounter for supervision of normal pregnancy, unspecified, first trimester; Sciatica without back pain; and Pelvic pain affecting pregnancy in second trimester, antepartum on their problem list.  Patient reports pain in left hip.  Contractions: Not present. Vag. Bleeding: None.  Movement: Present. Denies leaking of fluid.   The following portions of the patient's history were reviewed and updated as appropriate: allergies, current medications, past family history, past medical history, past social history, past surgical history and problem list.   Objective:   Vitals:   01/15/20 0914  BP: 129/84  Pulse: (!) 117  Weight: 183 lb (83 kg)    Fetal Status:     Movement: Present     General:  Alert, oriented and cooperative. Patient is in no acute distress.  Skin: Skin is warm and dry. No rash noted.   Cardiovascular: Normal heart rate noted  Respiratory: Normal respiratory effort, no problems with respiration noted  Abdomen: Soft, gravid, appropriate for gestational age.  Pain/Pressure: Absent     Pelvic: Cervical exam deferred        Extremities: Normal range of motion.  Edema: Trace  Mental Status: Normal mood and affect. Normal behavior. Normal judgment and thought content.   Assessment and Plan:  Pregnancy: G1P0 at [redacted]w[redacted]d  1. Encounter for supervision of other normal pregnancy in first trimester --Anticipatory guidance about next visits/weeks of pregnancy given. --Next visit in 2 weeks  - Glucose Tolerance, 2 Hours w/1 Hour - CBC - HIV Antibody (routine testing w rflx) - RPR  2. [redacted] weeks gestation of pregnancy   3. Sciatica of left side --Pt using pregnancy support belt in the daytime, Flexeril at night. Worse after on her feet at work. --Rest/ice/sciatica  stretches. Pt given some sciatica exercises to try. --Physical Therapy referral placed  Preterm labor symptoms and general obstetric precautions including but not limited to vaginal bleeding, contractions, leaking of fluid and fetal movement were reviewed in detail with the patient. Please refer to After Visit Summary for other counseling recommendations.   Return in about 2 weeks (around 01/29/2020).  Future Appointments  Date Time Provider [place]  01/29/2020  8:55 AM Left[place]-Kirby, Kathie Dike, CNM [facility] None    Fatima Blank, CNM

## 2020-01-15 NOTE — Progress Notes (Signed)
ROB  2 hr GTT today.  CC: Hip pain.

## 2020-01-15 NOTE — Patient Instructions (Addendum)
Sciatica  Sciatica is pain, numbness, weakness, or tingling along the path of the sciatic nerve. The sciatic nerve starts in the lower back and runs down the back of each leg. The nerve controls the muscles in the lower leg and in the back of the knee. It also provides feeling (sensation) to the back of the thigh, the lower leg, and the sole of the foot. Sciatica is a symptom of another medical condition that pinches or puts pressure on the sciatic nerve. Sciatica most often only affects one side of the body. Sciatica usually goes away on its own or with treatment. In some cases, sciatica may come back (recur). What are the causes? This condition is caused by pressure on the sciatic nerve or pinching of the nerve. This may be the result of:  A disk in between the bones of the spine bulging out too far (herniated disk).  Age-related changes in the spinal disks.  A pain disorder that affects a muscle in the buttock.  Extra bone growth near the sciatic nerve.  A break (fracture) of the pelvis.  Pregnancy.  Tumor. This is rare. What increases the risk? The following factors may make you more likely to develop this condition:  Playing sports that place pressure or stress on the spine.  Having poor strength and flexibility.  A history of back injury or surgery.  Sitting for long periods of time.  Doing activities that involve repetitive bending or lifting.  Obesity. What are the signs or symptoms? Symptoms can vary from mild to very severe, and they may include:  Any of these problems in the lower back, leg, hip, or buttock: ? Mild tingling, numbness, or dull aches. ? Burning sensations. ? Sharp pains.  Numbness in the back of the calf or the sole of the foot.  Leg weakness.  Severe back pain that makes movement difficult. Symptoms may get worse when you cough, sneeze, or laugh, or when you sit or stand for long periods of time. How is this diagnosed? This condition may be  diagnosed based on:  Your symptoms and medical history.  A physical exam.  Blood tests.  Imaging tests, such as: ? X-rays. ? MRI. ? CT scan. How is this treated? In many cases, this condition improves on its own without treatment. However, treatment may include:  Reducing or modifying physical activity.  Exercising and stretching.  Icing and applying heat to the affected area.  Medicines that help to: ? Relieve pain and swelling. ? Relax your muscles.  Injections of medicines that help to relieve pain, irritation, and inflammation around the sciatic nerve (steroids).  Surgery. Follow these instructions at home: Medicines  Take over-the-counter and prescription medicines only as told by your health care provider.  Ask your health care provider if the medicine prescribed to you: ? Requires you to avoid driving or using heavy machinery. ? Can cause constipation. You may need to take these actions to prevent or treat constipation:  Drink enough fluid to keep your urine pale yellow.  Take over-the-counter or prescription medicines.  Eat foods that are high in fiber, such as beans, whole grains, and fresh fruits and vegetables.  Limit foods that are high in fat and processed sugars, such as fried or sweet foods. Managing pain      If directed, put ice on the affected area. ? Put ice in a plastic bag. ? Place a towel between your skin and the bag. ? Leave the ice on for 20 minutes,   2-3 times a day.  If directed, apply heat to the affected area. Use the heat source that your health care provider recommends, such as a moist heat pack or a heating pad. ? Place a towel between your skin and the heat source. ? Leave the heat on for 20-30 minutes. ? Remove the heat if your skin turns bright red. This is especially important if you are unable to feel pain, heat, or cold. You may have a greater risk of getting burned. Activity   Return to your normal activities as told  by your health care provider. Ask your health care provider what activities are safe for you.  Avoid activities that make your symptoms worse.  Take brief periods of rest throughout the day. ? When you rest for longer periods, mix in some mild activity or stretching between periods of rest. This will help to prevent stiffness and pain. ? Avoid sitting for long periods of time without moving. Get up and move around at least one time each hour.  Exercise and stretch regularly, as told by your health care provider.  Do not lift anything that is heavier than 10 lb (4.5 kg) while you have symptoms of sciatica. When you do not have symptoms, you should still avoid heavy lifting, especially repetitive heavy lifting.  When you lift objects, always use proper lifting technique, which includes: ? Bending your knees. ? Keeping the load close to your body. ? Avoiding twisting. General instructions  Maintain a healthy weight. Excess weight puts extra stress on your back.  Wear supportive, comfortable shoes. Avoid wearing high heels.  Avoid sleeping on a mattress that is too soft or too hard. A mattress that is firm enough to support your back when you sleep may help to reduce your pain.  Keep all follow-up visits as told by your health care provider. This is important. Contact a health care provider if:  You have pain that: ? Wakes you up when you are sleeping. ? Gets worse when you lie down. ? Is worse than you have experienced in the past. ? Lasts longer than 4 weeks.  You have an unexplained weight loss. Get help right away if:  You are not able to control when you urinate or have bowel movements (incontinence).  You have: ? Weakness in your lower back, pelvis, buttocks, or legs that gets worse. ? Redness or swelling of your back. ? A burning sensation when you urinate. Summary  Sciatica is pain, numbness, weakness, or tingling along the path of the sciatic nerve.  This condition  is caused by pressure on the sciatic nerve or pinching of the nerve.  Sciatica can cause pain, numbness, or tingling in the lower back, legs, hips, and buttocks.  Treatment often includes rest, exercise, medicines, and applying ice or heat. This information is not intended to replace advice given to you by your health care provider. Make sure you discuss any questions you have with your health care provider. Document Revised: 02/06/2018 Document Reviewed: 02/06/2018 Elsevier Patient Education  2020 [employer] Sciatica Rehab Ask your health care provider which exercises are safe for you. Do exercises exactly as told by your health care provider and adjust them as directed. It is normal to feel mild stretching, pulling, tightness, or discomfort as you do these exercises. Stop right away if you feel sudden pain or your pain gets worse. Do not begin these exercises until told by your health care provider. Stretching and range-of-motion exercises These exercises warm up   your muscles and joints and improve the movement and flexibility of your hips and back. These exercises also help to relieve pain, numbness, and tingling. Sciatic nerve glide 1. Sit in a chair with your head facing down toward your chest. Place your hands behind your back. Let your shoulders slump forward. 2. Slowly straighten one of your legs while you tilt your head back as if you are looking toward the ceiling. Only straighten your leg as far as you can without making your symptoms worse. 3. Hold this position for __________ seconds. 4. Slowly return to the starting position. 5. Repeat with your other leg. Repeat __________ times. Complete this exercise __________ times a day. Knee to chest with hip adduction and internal rotation  1. Lie on your back on a firm surface with both legs straight. 2. Bend one of your knees and move it up toward your chest until you feel a gentle stretch in your lower back and buttock. Then, move  your knee toward the shoulder that is on the opposite side from your leg. This is hip adduction and internal rotation. ? Hold your leg in this position by holding on to the front of your knee. 3. Hold this position for __________ seconds. 4. Slowly return to the starting position. 5. Repeat with your other leg. Repeat __________ times. Complete this exercise __________ times a day. Prone extension on elbows  1. Lie on your abdomen on a firm surface. A bed may be too soft for this exercise. 2. Prop yourself up on your elbows. 3. Use your arms to help lift your chest up until you feel a gentle stretch in your abdomen and your lower back. ? This will place some of your body weight on your elbows. If this is uncomfortable, try stacking pillows under your chest. ? Your hips should stay down, against the surface that you are lying on. Keep your hip and back muscles relaxed. 4. Hold this position for __________ seconds. 5. Slowly relax your upper body and return to the starting position. Repeat __________ times. Complete this exercise __________ times a day. Strengthening exercises These exercises build strength and endurance in your back. Endurance is the ability to use your muscles for a long time, even after they get tired. Pelvic tilt This exercise strengthens the muscles that lie deep in the abdomen. 1. Lie on your back on a firm surface. Bend your knees and keep your feet flat on the floor. 2. Tense your abdominal muscles. Tip your pelvis up toward the ceiling and flatten your lower back into the floor. ? To help with this exercise, you may place a small towel under your lower back and try to push your back into the towel. 3. Hold this position for __________ seconds. 4. Let your muscles relax completely before you repeat this exercise. Repeat __________ times. Complete this exercise __________ times a day. Alternating arm and leg raises  1. Get on your hands and knees on a firm surface.  If you are on a hard floor, you may want to use padding, such as an exercise mat, to cushion your knees. 2. Line up your arms and legs. Your hands should be directly below your shoulders, and your knees should be directly below your hips. 3. Lift your left leg behind you. At the same time, raise your right arm and straighten it in front of you. ? Do not lift your leg higher than your hip. ? Do not lift your arm higher than your shoulder. ?   your shoulder. ? Keep your abdominal and back muscles tight. ? Keep your hips facing the ground. ? Do not arch your back. ? Keep your balance carefully, and do not hold your breath. 4. Hold this position for __________ seconds. 5. Slowly return to the starting position. 6. Repeat with your right leg and your left arm. Repeat __________ times. Complete this exercise __________ times a day. Posture and body mechanics Good posture and healthy body mechanics can help to relieve stress in your body's tissues and joints. Body mechanics refers to the movements and positions of your body while you do your daily activities. Posture is part of body mechanics. Good posture means:  Your spine is in its natural S-curve position (neutral).  Your shoulders are pulled back slightly.  Your head is not tipped forward. Follow these guidelines to improve your posture and body mechanics in your everyday activities. Standing   When standing, keep your spine neutral and your feet about hip width apart. Keep a slight bend in your knees. Your ears, shoulders, and hips should line up.  When you do a task in which you stand in one place for a long time, place one foot up on a stable object that is 2-4 inches (5-10 cm) high, such as a footstool. This helps keep your spine neutral. Sitting   When sitting, keep your spine neutral and keep your feet flat on the floor. Use a footrest, if necessary, and keep your thighs parallel to the floor. Avoid rounding your shoulders, and avoid tilting  your head forward.  When working at a desk or a computer, keep your desk at a height where your hands are slightly lower than your elbows. Slide your chair under your desk so you are close enough to maintain good posture.  When working at a computer, place your monitor at a height where you are looking straight ahead and you do not have to tilt your head forward or downward to look at the screen. Resting  When lying down and resting, avoid positions that are most painful for you.  If you have pain with activities such as sitting, bending, stooping, or squatting, lie in a position in which your body does not bend very much. For example, avoid curling up on your side with your arms and knees near your chest (fetal position).  If you have pain with activities such as standing for a long time or reaching with your arms, lie with your spine in a neutral position and bend your knees slightly. Try the following positions: ? Lying on your side with a pillow between your knees. ? Lying on your back with a pillow under your knees. Lifting   When lifting objects, keep your feet at least shoulder width apart and tighten your abdominal muscles.  Bend your knees and hips and keep your spine neutral. It is important to lift using the strength of your legs, not your back. Do not lock your knees straight out.  Always ask for help to lift heavy or awkward objects. This information is not intended to replace advice given to you by your health care provider. Make sure you discuss any questions you have with your health care provider. Document Revised: 05/12/2018 Document Reviewed: 02/09/2018 Elsevier Patient Education  [place].  Pelvic pain in pregnancy/Pubic Symphysis Pain  Some women develop pelvic pain in pregnancy. This is sometimes called pregnancy-related pelvic girdle pain (PGP) or symphysis pubis dysfunction (SPD). PGP is a collection of uncomfortable symptoms caused  by a stiffness of  your pelvic joints or the joints moving unevenly at either the back or front of your pelvis.  Symptoms of PGP PGP is not harmful to your baby, but it can be painful and make it hard to get around.  Women with PGP may feel pain:  . over the pubic bone at the front in the center, roughly level with your hips . across 1 or both sides of your lower back  . in the area between your vagina and anus (perineum)  . spreading to your thighs Some women feel or hear a clicking or grinding in the pelvic area.  The pain can be worse when you're:  . walking  . going up or down stairs . standing on 1 leg (for example, when you're getting dressed)  . turning over in bed  . moving your legs apart (for example, when you get out of a car) Most women with PGP can have a vaginal birth.  Non-urgent advice: Call your midwife or doctor if you have pelvic pain and: . it's hard for you to move around . it hurts to get out of a car or turn over in bed . it's painful going up or down stairs These can be signs of pregnancy-related pelvic girdle pain.  Treatments for PGP Getting diagnosed as early as possible can help keep pain to a minimum and avoid long-term discomfort.  You can ask your midwife for a referral to a physiotherapist who specializes in obstetric pelvic joint problems.  Physiotherapy aims to relieve or ease pain, improve muscle function, and improve your pelvic joint position and stability.  This may include:  . manual therapy to make sure the joints of your pelvis, hip and spine move normally  . exercises to strengthen your pelvic floor, stomach, back and hip muscles  . exercises in water  . advice and suggestions, including positions for labor and birth, looking after your baby and positions for sex  . pain relief, such as TENS . equipment, if necessary, such as crutches or pelvic support belts These problems tend not to get completely better until the baby is born, but treatment from an  experienced practitioner can improve the symptoms during pregnancy.  You can contact the [employer] ([employer]) for information and support.  Coping with pelvic pain in pregnancy Your physiotherapist may recommend a pelvic support belt to help ease your pain, or crutches to help you get around.  It can help to plan your day so you avoid activities that cause you pain. For example, do not go up or down stairs more often than you have to.  The Pelvic, Obstetric & Gynaecological Physi[facility] (POGP) network also offers this advice:  . be as active as possible within your pain limits, and avoid activities that make the pain worse  . rest when you can  . get help with household chores from your partner, family and friends  . wear flat, supportive shoes  . sit down to get dressed - for example, do not stand on 1 leg when putting on jeans  . keep your knees together when getting in and out of the car - a plastic bag on the seat can help you swivel  . sleep in a comfortable position - for example, on your side with a pillow between your legs  . try different ways of turning over in bed - for example, turning over with your knees together and squeezing your buttocks  . take the  stairs 1 at a time, or go upstairs backwards or on your bottom  . if you're using crutches, have a small backpack to carry things in  . if you want to have sex, consider different positions, such as kneeling on all fours  POGP suggests that you avoid:  . standing on 1 leg  . bending and twisting to lift, or carrying a baby on 1 hip  . crossing your legs  . sitting on the floor, or sitting twisted  . sitting or standing for long periods  . lifting heavy weights, such as shopping bags, wet washing or a toddler  . vacuuming  . pushing heavy objects, such as a supermarket trolley  . carrying anything in only 1 hand (try using a small backpack)  The physiotherapist should be able to provide advice on  coping with the emotional impact of living with chronic pain, such as using relaxation techniques. If your pain is causing you considerable distress, then you should let your doctor or midwife know. You may require additional treatment. Download the POGP leaflet Pregnancy-related pelvic girdle pain for mothers-to-be and new mothers (https://mcdowell-oliver.com/). You can get more information on managing everyday activities with PGP from the [employer] ([employer])  Labor and birth with pelvic pain Many women with pelvic pain in pregnancy can have a normal vaginal birth.  Plan ahead and talk about your birth plan with your birth partner and midwife.  Write in your birth plan that you have PGP, so the people supporting you during labor and birth will be aware of your condition.  Think about birth positions that are the most comfortable for you, and write them in your birth plan.  Being in water can take the weight off your joints and allow you to move more easily, so you might want to think about having a water birth. You can discuss this with your midwife.   Your 'pain-free range of movement' If you have pain when you open your legs, find out your pain-free range of movement.  To do this, lie on your back or sit on the edge of a chair and open your legs as far as you can without pain. Your partner or midwife can measure the distance between your knees with a tape measure. This is your pain-free range.  To protect your joints, try not to open your legs wider than this during labor and birth.  This is particularly important if you have an epidural for pain relief in labor, as you won't be feeling the pain that warns you that you're separating your legs too far.  If you have an epidural, make sure your midwife and birth partner are aware of your pain-free range of movement of your legs. When pushing in the second stage of labor, you may find it beneficial to lie on one  side.  This prevents your legs from being separated too much. You can stay in this position for the birth of your baby, if you wish. Sometimes it might be necessary to open your legs wider than your pain-free range to deliver your baby safely, particularly if you have an assisted delivery (for example, with the vacuum or foreps). Even in this case, it's possible to limit the separation of your legs. Make sure your midwife and doctor are aware that you have PGP.  If you go beyond your pain-free range, your physiotherapist should assess you after the birth.  Take extra care until they have assessed and advised you.  Who  gets pelvic pain in pregnancy? It's estimated that PGP affects up to 1 in 5 pregnant women to some degree.  It's not known exactly why pelvic pain affects some women, but it's thought to be linked to a number of issues, including previous damage to the pelvis, pelvic joints moving unevenly, and the weight or position of the baby.  Factors that may make a woman more likely to develop PGP include:  . a history of lower back or pelvic girdle pain  . previous injury to the pelvis (for example, from a fall or accident) . having PGP in a previous pregnancy  . a physically demanding job . being overweight . having a multiple birth pregnancy  [employer]alt[employer].org has interviews with women talking about their experiences of pelvic pain in pregnancy. Read more about coping with common pregnancy problems (www.nhs.uk/conditions/pregnancy-and-baby) including nausea, heartburn, tiredness and constipation.  Talk with your doctor or midwife for more information.

## 2020-01-16 LAB — CBC
Hematocrit: 37.1 % (ref 34.0–46.6)
Hemoglobin: 12.5 g/dL (ref 11.1–15.9)
MCH: 29.8 pg (ref 26.6–33.0)
MCHC: 33.7 g/dL (ref 31.5–35.7)
MCV: 89 fL (ref 79–97)
Platelets: 270 10*3/uL (ref 150–450)
RBC: 4.19 x10E6/uL (ref 3.77–5.28)
RDW: 13.3 % (ref 11.7–15.4)
WBC: 11 10*3/uL — ABNORMAL HIGH (ref 3.4–10.8)

## 2020-01-16 LAB — GLUCOSE TOLERANCE, 2 HOURS W/ 1HR
Glucose, 1 hour: 152 mg/dL (ref 65–179)
Glucose, 2 hour: 101 mg/dL (ref 65–152)
Glucose, Fasting: 79 mg/dL (ref 65–91)

## 2020-01-16 LAB — HIV ANTIBODY (ROUTINE TESTING W REFLEX): HIV Screen 4th Generation wRfx: NONREACTIVE

## 2020-01-16 LAB — RPR: RPR Ser Ql: NONREACTIVE

## 2020-01-29 ENCOUNTER — Other Ambulatory Visit: Payer: Self-pay

## 2020-01-29 ENCOUNTER — Ambulatory Visit (INDEPENDENT_AMBULATORY_CARE_PROVIDER_SITE_OTHER): Payer: Medicaid Other | Admitting: Advanced Practice Midwife

## 2020-01-29 VITALS — BP 128/79 | HR 105 | Wt 183.3 lb

## 2020-01-29 DIAGNOSIS — Z23 Encounter for immunization: Secondary | ICD-10-CM

## 2020-01-29 DIAGNOSIS — Z3481 Encounter for supervision of other normal pregnancy, first trimester: Secondary | ICD-10-CM | POA: Diagnosis not present

## 2020-01-29 DIAGNOSIS — R102 Pelvic and perineal pain: Secondary | ICD-10-CM

## 2020-01-29 DIAGNOSIS — Z3A31 31 weeks gestation of pregnancy: Secondary | ICD-10-CM

## 2020-01-29 DIAGNOSIS — O26893 Other specified pregnancy related conditions, third trimester: Secondary | ICD-10-CM

## 2020-01-29 NOTE — Progress Notes (Signed)
Patient presents for ROB. Patient has no concerns today. Tdap vaccine given.

## 2020-01-29 NOTE — Patient Instructions (Signed)
Third Trimester of Pregnancy The third trimester is from week 28 through week 40 (months 7 through 9). The third trimester is a time when the unborn baby (fetus) is growing rapidly. At the end of the ninth month, the fetus is about 20 inches in length and weighs 6-10 pounds. Body changes during your third trimester Your body will continue to go through many changes during pregnancy. The changes vary from woman to woman. During the third trimester:  Your weight will continue to increase. You can expect to gain 25-35 pounds (11-16 kg) by the end of the pregnancy.  You may begin to get stretch marks on your hips, abdomen, and breasts.  You may urinate more often because the fetus is moving lower into your pelvis and pressing on your bladder.  You may develop or continue to have heartburn. This is caused by increased hormones that slow down muscles in the digestive tract.  You may develop or continue to have constipation because increased hormones slow digestion and cause the muscles that push waste through your intestines to relax.  You may develop hemorrhoids. These are swollen veins (varicose veins) in the rectum that can itch or be painful.  You may develop swollen, bulging veins (varicose veins) in your legs.  You may have increased body aches in the pelvis, back, or thighs. This is due to weight gain and increased hormones that are relaxing your joints.  You may have changes in your hair. These can include thickening of your hair, rapid growth, and changes in texture. Some women also have hair loss during or after pregnancy, or hair that feels dry or thin. Your hair will most likely return to normal after your baby is born.  Your breasts will continue to grow and they will continue to become tender. A yellow fluid (colostrum) may leak from your breasts. This is the first milk you are producing for your baby.  Your belly button may stick out.  You may notice more swelling in your hands,  face, or ankles.  You may have increased tingling or numbness in your hands, arms, and legs. The skin on your belly may also feel numb.  You may feel short of breath because of your expanding uterus.  You may have more problems sleeping. This can be caused by the size of your belly, increased need to urinate, and an increase in your body's metabolism.  You may notice the fetus "dropping," or moving lower in your abdomen (lightening).  You may have increased vaginal discharge.  You may notice your joints feel loose and you may have pain around your pelvic bone. What to expect at prenatal visits You will have prenatal exams every 2 weeks until week 36. Then you will have weekly prenatal exams. During a routine prenatal visit:  You will be weighed to make sure you and the baby are growing normally.  Your blood pressure will be taken.  Your abdomen will be measured to track your baby's growth.  The fetal heartbeat will be listened to.  Any test results from the previous visit will be discussed.  You may have a cervical check near your due date to see if your cervix has softened or thinned (effaced).  You will be tested for Group B streptococcus. This happens between 35 and 37 weeks. Your health care provider may ask you:  What your birth plan is.  How you are feeling.  If you are feeling the baby move.  If you have had any abnormal   symptoms, such as leaking fluid, bleeding, severe headaches, or abdominal cramping.  If you are using any tobacco products, including cigarettes, chewing tobacco, and electronic cigarettes.  If you have any questions. Other tests or screenings that may be performed during your third trimester include:  Blood tests that check for low iron levels (anemia).  Fetal testing to check the health, activity level, and growth of the fetus. Testing is done if you have certain medical conditions or if there are problems during the pregnancy.  Nonstress test  (NST). This test checks the health of your baby to make sure there are no signs of problems, such as the baby not getting enough oxygen. During this test, a belt is placed around your belly. The baby is made to move, and its heart rate is monitored during movement. What is false labor? False labor is a condition in which you feel small, irregular tightenings of the muscles in the womb (contractions) that usually go away with rest, changing position, or drinking water. These are called Braxton Hicks contractions. Contractions may last for hours, days, or even weeks before true labor sets in. If contractions come at regular intervals, become more frequent, increase in intensity, or become painful, you should see your health care provider. What are the signs of labor?  Abdominal cramps.  Regular contractions that start at 10 minutes apart and become stronger and more frequent with time.  Contractions that start on the top of the uterus and spread down to the lower abdomen and back.  Increased pelvic pressure and dull back pain.  A watery or bloody mucus discharge that comes from the vagina.  Leaking of amniotic fluid. This is also known as your "water breaking." It could be a slow trickle or a gush. Let your health care provider know if it has a color or strange odor. If you have any of these signs, call your health care provider right away, even if it is before your due date. Follow these instructions at home: Medicines  Follow your health care provider's instructions regarding medicine use. Specific medicines may be either safe or unsafe to take during pregnancy.  Take a prenatal vitamin that contains at least 600 micrograms (mcg) of folic acid.  If you develop constipation, try taking a stool softener if your health care provider approves. Eating and drinking   Eat a balanced diet that includes fresh fruits and vegetables, whole grains, good sources of protein such as meat, eggs, or tofu,  and low-fat dairy. Your health care provider will help you determine the amount of weight gain that is right for you.  Avoid raw meat and uncooked cheese. These carry germs that can cause birth defects in the baby.  If you have low calcium intake from food, talk to your health care provider about whether you should take a daily calcium supplement.  Eat four or five small meals rather than three large meals a day.  Limit foods that are high in fat and processed sugars, such as fried and sweet foods.  To prevent constipation: ? Drink enough fluid to keep your urine clear or pale yellow. ? Eat foods that are high in fiber, such as fresh fruits and vegetables, whole grains, and beans. Activity  Exercise only as directed by your health care provider. Most women can continue their usual exercise routine during pregnancy. Try to exercise for 30 minutes at least 5 days a week. Stop exercising if you experience uterine contractions.  Avoid heavy lifting.  Do   not exercise in extreme heat or humidity, or at high altitudes.  Wear low-heel, comfortable shoes.  Practice good posture.  You may continue to have sex unless your health care provider tells you otherwise. Relieving pain and discomfort  Take frequent breaks and rest with your legs elevated if you have leg cramps or low back pain.  Take warm sitz baths to soothe any pain or discomfort caused by hemorrhoids. Use hemorrhoid cream if your health care provider approves.  Wear a good support bra to prevent discomfort from breast tenderness.  If you develop varicose veins: ? Wear support pantyhose or compression stockings as told by your healthcare provider. ? Elevate your feet for 15 minutes, 3-4 times a day. Prenatal care  Write down your questions. Take them to your prenatal visits.  Keep all your prenatal visits as told by your health care provider. This is important. Safety  Wear your seat belt at all times when driving.  Make  a list of emergency phone numbers, including numbers for family, friends, the hospital, and police and fire departments. General instructions  Avoid cat litter boxes and soil used by cats. These carry germs that can cause birth defects in the baby. If you have a cat, ask someone to clean the litter box for you.  Do not travel far distances unless it is absolutely necessary and only with the approval of your health care provider.  Do not use hot tubs, steam rooms, or saunas.  Do not drink alcohol.  Do not use any products that contain nicotine or tobacco, such as cigarettes and e-cigarettes. If you need help quitting, ask your health care provider.  Do not use any medicinal herbs or unprescribed drugs. These chemicals affect the formation and growth of the baby.  Do not douche or use tampons or scented sanitary pads.  Do not cross your legs for long periods of time.  To prepare for the arrival of your baby: ? Take prenatal classes to understand, practice, and ask questions about labor and delivery. ? Make a trial run to the hospital. ? Visit the hospital and tour the maternity area. ? Arrange for maternity or paternity leave through employers. ? Arrange for family and friends to take care of pets while you are in the hospital. ? Purchase a rear-facing car seat and make sure you know how to install it in your car. ? Pack your hospital bag. ? Prepare the baby's nursery. Make sure to remove all pillows and stuffed animals from the baby's crib to prevent suffocation.  Visit your dentist if you have not gone during your pregnancy. Use a soft toothbrush to brush your teeth and be gentle when you floss. Contact a health care provider if:  You are unsure if you are in labor or if your water has broken.  You become dizzy.  You have mild pelvic cramps, pelvic pressure, or nagging pain in your abdominal area.  You have lower back pain.  You have persistent nausea, vomiting, or  diarrhea.  You have an unusual or bad smelling vaginal discharge.  You have pain when you urinate. Get help right away if:  Your water breaks before 37 weeks.  You have regular contractions less than 5 minutes apart before 37 weeks.  You have a fever.  You are leaking fluid from your vagina.  You have spotting or bleeding from your vagina.  You have severe abdominal pain or cramping.  You have rapid weight loss or weight gain.  You have   shortness of breath with chest pain.  You notice sudden or extreme swelling of your face, hands, ankles, feet, or legs.  Your baby makes fewer than 10 movements in 2 hours.  You have severe headaches that do not go away when you take medicine.  You have vision changes. Summary  The third trimester is from week 28 through week 40, months 7 through 9. The third trimester is a time when the unborn baby (fetus) is growing rapidly.  During the third trimester, your discomfort may increase as you and your baby continue to gain weight. You may have abdominal, leg, and back pain, sleeping problems, and an increased need to urinate.  During the third trimester your breasts will keep growing and they will continue to become tender. A yellow fluid (colostrum) may leak from your breasts. This is the first milk you are producing for your baby.  False labor is a condition in which you feel small, irregular tightenings of the muscles in the womb (contractions) that eventually go away. These are called Braxton Hicks contractions. Contractions may last for hours, days, or even weeks before true labor sets in.  Signs of labor can include: abdominal cramps; regular contractions that start at 10 minutes apart and become stronger and more frequent with time; watery or bloody mucus discharge that comes from the vagina; increased pelvic pressure and dull back pain; and leaking of amniotic fluid. This information is not intended to replace advice given to you by your  health care provider. Make sure you discuss any questions you have with your health care provider. Document Revised: 05/11/2018 Document Reviewed: 02/24/2016 Elsevier Patient Education  2020 [employer]  

## 2020-01-29 NOTE — Progress Notes (Signed)
   PRENATAL VISIT NOTE  Subjective:  Catherine Paul is a 24 y.o. G1P0 at [redacted]w[redacted]d being seen today for ongoing prenatal care.  She is currently monitored for the following issues for this low-risk pregnancy and has Encounter for supervision of normal pregnancy, unspecified, first trimester; Sciatica without back pain; and Pelvic pain affecting pregnancy in second trimester, antepartum on their problem list.  Patient reports pelvic/hip pain.  Contractions: Irritability. Vag. Bleeding: None.  Movement: Present. Denies leaking of fluid.   The following portions of the patient's history were reviewed and updated as appropriate: allergies, current medications, past family history, past medical history, past social history, past surgical history and problem list.   Objective:   Vitals:   01/29/20 0858  BP: 128/79  Pulse: (!) 105  Weight: 183 lb 4.8 oz (83.1 kg)    Fetal Status: Fetal Heart Rate (bpm): 140   Movement: Present     General:  Alert, oriented and cooperative. Patient is in no acute distress.  Skin: Skin is warm and dry. No rash noted.   Cardiovascular: Normal heart rate noted  Respiratory: Normal respiratory effort, no problems with respiration noted  Abdomen: Soft, gravid, appropriate for gestational age.  Pain/Pressure: Absent     Pelvic: Cervical exam deferred        Extremities: Normal range of motion.  Edema: None  Mental Status: Normal mood and affect. Normal behavior. Normal judgment and thought content.   Assessment and Plan:  Pregnancy: G1P0 at [redacted]w[redacted]d 1. Encounter for supervision of other normal pregnancy in first trimester --Anticipatory guidance about next visits/weeks of pregnancy given. --Next visit in 2 weeks  - Tdap vaccine greater than or equal to 7yo IM  2. [redacted] weeks gestation of pregnancy  - Tdap vaccine greater than or equal to 7yo IM  3. Pelvic pain affecting pregnancy in third trimester, antepartum --Pt doing exercises suggested at last visit at home and  wearing support belt, which help. She stands for work so pain improves when at rest but needs to keep working. - Ambulatory referral to Physical Therapy  Preterm labor symptoms and general obstetric precautions including but not limited to vaginal bleeding, contractions, leaking of fluid and fetal movement were reviewed in detail with the patient. Please refer to After Visit Summary for other counseling recommendations.   Return in about 2 weeks (around 02/12/2020).  Future Appointments  Date Time Provider Department Center  02/12/2020  8:45 AM Brock Bad, MD [facility] None    Sharen Counter, CNM

## 2020-02-02 NOTE — L&D Delivery Note (Addendum)
OB/GYN Faculty Practice Delivery Note  Catherine Paul is a 25 y.o. G1P1001 s/p vacuum-assisted vaginal delivery at [redacted]w[redacted]d. She was admitted for IOL secondary to gHTN.   ROM: 3h 19m with clear fluid GBS Status: negative Maximum Maternal Temperature: 98.72F  Labor Progress: On admission FB was placed and cytotec was administered. Pitocin was started at 1130. AROM for clear fluid at 1400. At 1415 an IUPC was placed and amnioinfusion was started given recurrent variable decels with good improvement in fetal heart tracing. Pt then progressed to complete cervical dilation at 1723. Pt with good effort during second stage. However, Pryor Curia was called to pt room to assess given new onset of vaginal bleeding with several large clots.  Delivery Date/Time: 03/13/20 at 1755  Indication for operative vaginal delivery: vaginal bleeding with concern for partial abruption given associated fetal bradycardia  Patient was examined and found to be fully dilated with fetal station of +2 for the BPD, direct OA.  Patient's bladder was empty, and there were no known fetal contraindications to operative vaginal delivery. EFW was 65%ile on prior ultrasound and pelvis felt adequate.  FHR tracing remarkable for recurrent deep prolonged decelerations with contractions.  Risks of vacuum assistance were discussed in detail, including but not limited to, bleeding, infection, damage to maternal tissues, fetal cephalohematoma, inability to effect vaginal delivery of the head or shoulder dystocia that cannot be resolved by established maneuvers and need for emergency cesarean section.  Patient gave verbal consent.  The soft vacuum cup (Kiwi) was positioned over flexion point and used per the manufacturer's instructions with suction to the green zone with pushing and releasing of the suction in between contractions, with two contractions during the course of the operative vaginal delivery.  The infant was then delivered atraumatically,  noted to be a viable female/female infant, Apgars of 8 and 9. Arterial cord gas was sent but unable to result given sample was not sent at time of delivery. There was spontaneous placental delivery, intact with three-vessel cord. Second degree perineal laceration noted requiring repair with 3-0 Vicryl in the usual fashion. EBL 271 ml. Epidural anesthesia.    Sponge, instrument and needle counts were correct x 2.  The patient and baby were stable after delivery and remained in couplet care, with plans to transfer later to postpartum unit  Placenta: 3-vessel cord, intact, sent to pathology Complications: vaginal bleeding with concern for partial abruption Lacerations: second degree perineal laceration and right labial laceration s/p repair with 3-0 vicryl & 4-0 vicryl EBL: 271 ml Analgesia: epidural  Infant: viable female  APGARs 8 & 9  weight 3160g  I performed vacuum assisted vaginal delivery as noted above with the assistance of Dr. Ilda Basset. I assisted resident physician, Dr. Ian Bushman, with laceration repairs as noted above.  Guillermina City, MD OB/GYN Fellow, Faculty Practice   Attestation of Attending Supervision of Fellow: Evaluation and management procedures were performed by the fellow under my supervision and I was present for the entirety of the vacuum assisted delivery.  I have seen and examined the patient,  reviewed the fellow's note and chart, and I agree with the management and plan.   Durene Romans MD Attending Center for [employer] manager)

## 2020-02-05 ENCOUNTER — Other Ambulatory Visit: Payer: Self-pay

## 2020-02-05 ENCOUNTER — Ambulatory Visit: Payer: Medicaid Other | Admitting: *Deleted

## 2020-02-05 ENCOUNTER — Other Ambulatory Visit (HOSPITAL_COMMUNITY)
Admission: RE | Admit: 2020-02-05 | Discharge: 2020-02-05 | Disposition: A | Discharge status: Home / Self Care | Payer: Medicaid Other | Source: Ambulatory Visit | Attending: Obstetrics | Admitting: Obstetrics

## 2020-02-05 VITALS — BP 115/79 | HR 106

## 2020-02-05 DIAGNOSIS — O26893 Other specified pregnancy related conditions, third trimester: Secondary | ICD-10-CM | POA: Insufficient documentation

## 2020-02-05 DIAGNOSIS — Z3400 Encounter for supervision of normal first pregnancy, unspecified trimester: Secondary | ICD-10-CM | POA: Insufficient documentation

## 2020-02-05 DIAGNOSIS — N898 Other specified noninflammatory disorders of vagina: Secondary | ICD-10-CM

## 2020-02-05 NOTE — Progress Notes (Signed)
Pt is in office for self swab for greenish discharge. Pt states she is having slight vaginal itch as well.  Self swab collected by pt and will be sent for full panel swab.  Pt states that she had a recent fall, did not land near abd area.  Reports +FM, occ irritability.  Denies any bleeding or LOF. Pt states she has also been nauseas and dizzy.   BP 115/79   Pulse (!) 106   LMP 06/27/2019    Pt advised to make sure she is staying well hydrated and eating good diet. Pt states BP has been normal at home.  Advised to continue to monitor dizziness, if worsens to call office or discuss at next appt.

## 2020-02-06 LAB — CERVICOVAGINAL ANCILLARY ONLY
Bacterial Vaginitis (gardnerella): POSITIVE — AB
Candida Glabrata: NEGATIVE
Candida Vaginitis: POSITIVE — AB
Chlamydia: NEGATIVE
Comment: NEGATIVE
Comment: NEGATIVE
Comment: NEGATIVE
Comment: NEGATIVE
Comment: NEGATIVE
Comment: NORMAL
Neisseria Gonorrhea: NEGATIVE
Trichomonas: NEGATIVE

## 2020-02-07 ENCOUNTER — Other Ambulatory Visit: Payer: Self-pay

## 2020-02-07 ENCOUNTER — Encounter (HOSPITAL_COMMUNITY): Payer: Self-pay | Admitting: Family Medicine

## 2020-02-07 ENCOUNTER — Inpatient Hospital Stay (HOSPITAL_COMMUNITY)
Admission: AD | Admit: 2020-02-07 | Discharge: 2020-02-07 | Disposition: A | Discharge status: Home / Self Care | Payer: Medicaid Other | Attending: Family Medicine | Admitting: Family Medicine

## 2020-02-07 DIAGNOSIS — O26893 Other specified pregnancy related conditions, third trimester: Secondary | ICD-10-CM | POA: Diagnosis not present

## 2020-02-07 DIAGNOSIS — O23593 Infection of other part of genital tract in pregnancy, third trimester: Secondary | ICD-10-CM | POA: Insufficient documentation

## 2020-02-07 DIAGNOSIS — B373 Candidiasis of vulva and vagina: Secondary | ICD-10-CM | POA: Insufficient documentation

## 2020-02-07 DIAGNOSIS — O99891 Other specified diseases and conditions complicating pregnancy: Secondary | ICD-10-CM

## 2020-02-07 DIAGNOSIS — B3731 Acute candidiasis of vulva and vagina: Secondary | ICD-10-CM

## 2020-02-07 DIAGNOSIS — M7918 Myalgia, other site: Secondary | ICD-10-CM

## 2020-02-07 DIAGNOSIS — R102 Pelvic and perineal pain: Secondary | ICD-10-CM | POA: Insufficient documentation

## 2020-02-07 DIAGNOSIS — Z3A32 32 weeks gestation of pregnancy: Secondary | ICD-10-CM

## 2020-02-07 DIAGNOSIS — O98813 Other maternal infectious and parasitic diseases complicating pregnancy, third trimester: Secondary | ICD-10-CM

## 2020-02-07 LAB — URINALYSIS, ROUTINE W REFLEX MICROSCOPIC
Bilirubin Urine: NEGATIVE
Glucose, UA: >=500 mg/dL — AB
Hgb urine dipstick: NEGATIVE
Ketones, ur: NEGATIVE mg/dL
Nitrite: NEGATIVE
Protein, ur: NEGATIVE mg/dL
Specific Gravity, Urine: 1.008 (ref 1.005–1.030)
pH: 6 (ref 5.0–8.0)

## 2020-02-07 MED ORDER — METRONIDAZOLE 500 MG PO TABS: 500.0000 mg | ORAL_TABLET | Freq: Two times a day (BID) | ORAL | 0 refills | Status: DC

## 2020-02-07 MED ORDER — TERCONAZOLE 0.4 % VA CREA: 1.0000 | TOPICAL_CREAM | Freq: Every day | VAGINAL | 0 refills | Status: DC

## 2020-02-07 NOTE — Discharge Instructions (Signed)

## 2020-02-07 NOTE — MAU Note (Addendum)
Having pinching pain right at pubic bone. Started last night. Felt same pain today at work when putting items in cooler. Pain unrelated to movement or anything in particular. Denies VB or d/c. Denies dysuria

## 2020-02-07 NOTE — Telephone Encounter (Signed)
TC to patient regarding test results. I have left a detail message regarding her results/ medications have been called into pharmacy.

## 2020-02-07 NOTE — MAU Provider Note (Signed)
History     CSN: [id number]  Arrival date and time: 02/07/20 1926   Event Date/Time   First Provider Initiated Contact with Patient 02/07/20 2148      Chief Complaint  Patient presents with  . Pelvic Pain   HPI Catherine Paul is a 25 y.o. G1P0 at 34w5dwho presents with sharp shooting pain in the front of her pelvis. She states it comes and goes and is a 7/10 when it happens. She denies any pain at this time. She states the pain typically comes with movement or bending down. She has not tried anything for the pain and does not like taking medication. She wears a pregnancy support belt and states she does not have the pain when she wears it. She states she is currently being treated for yeast and started the medication tonight. She denies any bleeding. Reports normal fetal movement.   OB History    Gravida  1   Para      Term      Preterm      AB      Living        SAB      IAB      Ectopic      Multiple      Live Births              No past medical history on file.  No past surgical history on file.  Family History  Problem Relation Age of Onset  . Diabetes Maternal Grandmother   . Hypertension Maternal Grandmother   . Hypertension Maternal Grandfather   . Stroke Maternal Grandfather   . Hypertension Paternal Grandmother     Social History   Tobacco Use  . Smoking status: Never Smoker  . Smokeless tobacco: Never Used  Vaping Use  . Vaping Use: Never used  Substance Use Topics  . Alcohol use: Not Currently  . Drug use: Never    Allergies: No Known Allergies  Medications Prior to Admission  Medication Sig Dispense Refill Last Dose  . Blood Pressure Monitor KIT 1 kit by Does not apply route once a week. 1 kit 0   . cyclobenzaprine (FLEXERIL) 5 MG tablet Take 1-2 tablets (5-10 mg total) by mouth 3 (three) times daily as needed for muscle spasms. 30 tablet 0   . metroNIDAZOLE (FLAGYL) 500 MG tablet Take 1 tablet (500 mg total) by mouth 2 (two)  times daily. 14 tablet 0   . Misc. Devices MISC Dispense one maternity belt for patient 1 each 0   . Prenatal Vit-Fe Phos-FA-Omega (VITAFOL GUMMIES) 3.33-0.333-34.8 MG CHEW Chew 3 tablets by mouth daily. 90 tablet 11   . terconazole (TERAZOL 7) 0.4 % vaginal cream Place 1 applicator vaginally at bedtime. 45 g 0     Review of Systems  Constitutional: Negative.  Negative for fatigue and fever.  HENT: Negative.   Respiratory: Negative.  Negative for shortness of breath.   Cardiovascular: Negative.  Negative for chest pain.  Gastrointestinal: Negative.  Negative for abdominal pain, constipation, diarrhea, nausea and vomiting.  Genitourinary: Positive for pelvic pain. Negative for dysuria, vaginal bleeding and vaginal discharge.  Neurological: Negative.  Negative for dizziness and headaches.   Physical Exam   Blood pressure 120/78, pulse (!) 113, temperature 98.6 F (37 C), resp. rate 18, height 5' 3"  (1.6 m), weight 84.4 kg, last menstrual period 06/27/2019.  Physical Exam Vitals and nursing note reviewed.  Constitutional:      General: She is  not in acute distress.    Appearance: She is well-developed and well-nourished.  HENT:     Head: Normocephalic.  Eyes:     Pupils: Pupils are equal, round, and reactive to light.  Cardiovascular:     Rate and Rhythm: Normal rate and regular rhythm.     Heart sounds: Normal heart sounds.  Pulmonary:     Effort: Pulmonary effort is normal. No respiratory distress.     Breath sounds: Normal breath sounds.  Abdominal:     General: Bowel sounds are normal. There is no distension.     Palpations: Abdomen is soft.     Tenderness: There is no abdominal tenderness.  Genitourinary:    Comments: Thick white discharge on glove after cervical exam Skin:    General: Skin is warm and dry.  Neurological:     Mental Status: She is alert and oriented to person, place, and time.  Psychiatric:        Mood and Affect: Mood and affect normal.         Behavior: Behavior normal.        Thought Content: Thought content normal.        Judgment: Judgment normal.     Dilation: Closed Presentation: Vertex Exam by:: Sharolyn Douglas, CNM  Fetal Tracing:  Baseline: 150 Variability: moderate Accels: 15x15 Decels: none  Toco: 2 ucs   MAU Course  Procedures Results for orders placed or performed during the hospital encounter of 02/07/20 (from the past 24 hour(s))  Urinalysis, Routine w reflex microscopic Urine, Clean Catch     Status: Abnormal   Collection Time: 02/07/20  8:00 PM  Result Value Ref Range   Color, Urine YELLOW YELLOW   APPearance TURBID (A) CLEAR   Specific Gravity, Urine 1.008 1.005 - 1.030   pH 6.0 5.0 - 8.0   Glucose, UA >=500 (A) NEGATIVE mg/dL   Hgb urine dipstick NEGATIVE NEGATIVE   Bilirubin Urine NEGATIVE NEGATIVE   Ketones, ur NEGATIVE NEGATIVE mg/dL   Protein, ur NEGATIVE NEGATIVE mg/dL   Nitrite NEGATIVE NEGATIVE   Leukocytes,Ua LARGE (A) NEGATIVE   RBC / HPF 11-20 0 - 5 RBC/hpf   WBC, UA 21-50 0 - 5 WBC/hpf   Bacteria, UA MANY (A) NONE SEEN   Squamous Epithelial / LPF 6-10 0 - 5   MDM UA, UC NST reactive No signs of labor at this time.   Discussed with patient wearing pregnancy support belt more frequently throughout the day and using tylenol as needed. Discussed PT referral if pain continues. Encouraged patient to continue using yeast infection medication as prescribed as yeast was still evident on exam. Will culture urine and call if treatment is needed  Assessment and Plan   1. Pain in symphysis pubis during pregnancy   2. [redacted] weeks gestation of pregnancy   3. Candidiasis of vagina    -Discharge home in stable condition -Rx for terazol sent to patient's pharmacy -Preterm labor precautions discussed -Patient advised to follow-up with OB as scheduled for prenatal care -Patient may return to MAU as needed or if her condition were to change or worsen   Wende Mott CNM 02/07/2020, 9:48 PM

## 2020-02-09 LAB — CULTURE, OB URINE

## 2020-02-12 ENCOUNTER — Encounter: Payer: Self-pay | Admitting: Obstetrics

## 2020-02-12 ENCOUNTER — Ambulatory Visit (INDEPENDENT_AMBULATORY_CARE_PROVIDER_SITE_OTHER): Payer: Medicaid Other | Admitting: Obstetrics

## 2020-02-12 ENCOUNTER — Other Ambulatory Visit: Payer: Self-pay

## 2020-02-12 VITALS — BP 126/81 | HR 101 | Wt 186.4 lb

## 2020-02-12 DIAGNOSIS — Z3400 Encounter for supervision of normal first pregnancy, unspecified trimester: Secondary | ICD-10-CM

## 2020-02-12 NOTE — Progress Notes (Signed)
Subjective:  Lavora Brisbon is a 25 y.o. G1P0 at [redacted]w[redacted]d being seen today for ongoing prenatal care.  She is currently monitored for the following issues for this low-risk pregnancy and has Encounter for supervision of normal pregnancy, unspecified, first trimester; Sciatica without back pain; and Pelvic pain affecting pregnancy in second trimester, antepartum on their problem list.  Patient reports no complaints.  Contractions: Irritability. Vag. Bleeding: None.  Movement: Present. Denies leaking of fluid.   The following portions of the patient's history were reviewed and updated as appropriate: allergies, current medications, past family history, past medical history, past social history, past surgical history and problem list. Problem list updated.  Objective:   Vitals:   02/12/20 0844  BP: 126/81  Pulse: (!) 101  Weight: 186 lb 6.4 oz (84.6 kg)    Fetal Status:     Movement: Present     General:  Alert, oriented and cooperative. Patient is in no acute distress.  Skin: Skin is warm and dry. No rash noted.   Cardiovascular: Normal heart rate noted  Respiratory: Normal respiratory effort, no problems with respiration noted  Abdomen: Soft, gravid, appropriate for gestational age. Pain/Pressure: Absent     Pelvic:  Cervical exam deferred        Extremities: Normal range of motion.  Edema: None  Mental Status: Normal mood and affect. Normal behavior. Normal judgment and thought content.   Urinalysis:      Assessment and Plan:  Pregnancy: G1P0 at [redacted]w[redacted]d  1. Supervision of normal first pregnancy, antepartum   Preterm labor symptoms and general obstetric precautions including but not limited to vaginal bleeding, contractions, leaking of fluid and fetal movement were reviewed in detail with the patient. Please refer to After Visit Summary for other counseling recommendations.   Return in about 2 weeks (around 02/26/2020) for MyChart.   Shelly Bombard, MD  02/12/20

## 2020-02-12 NOTE — Progress Notes (Signed)
Patient presents for ROB. Patient has no concerns today. 

## 2020-02-26 ENCOUNTER — Encounter: Payer: Self-pay | Admitting: Advanced Practice Midwife

## 2020-02-26 ENCOUNTER — Telehealth (INDEPENDENT_AMBULATORY_CARE_PROVIDER_SITE_OTHER): Payer: Medicaid Other | Admitting: Advanced Practice Midwife

## 2020-02-26 VITALS — BP 121/85 | HR 88

## 2020-02-26 DIAGNOSIS — Z3400 Encounter for supervision of normal first pregnancy, unspecified trimester: Secondary | ICD-10-CM

## 2020-02-26 DIAGNOSIS — Z3481 Encounter for supervision of other normal pregnancy, first trimester: Secondary | ICD-10-CM

## 2020-02-26 DIAGNOSIS — Z3A35 35 weeks gestation of pregnancy: Secondary | ICD-10-CM

## 2020-02-26 DIAGNOSIS — O26893 Other specified pregnancy related conditions, third trimester: Secondary | ICD-10-CM

## 2020-02-26 DIAGNOSIS — R109 Unspecified abdominal pain: Secondary | ICD-10-CM

## 2020-02-26 NOTE — Progress Notes (Signed)
   OBSTETRICS PRENATAL VIRTUAL VISIT ENCOUNTER NOTE  Provider location: [facility] [place] [facility] [place]   I connected with Catherine Paul on 02/26/20 at  8:35 AM EST by MyChart Video Encounter at home and verified that I am speaking with the correct person using two identifiers.   I discussed the limitations, risks, security and privacy concerns of performing an evaluation and management service virtually and the availability of in person appointments. I also discussed with the patient that there may be a patient responsible charge related to this service. The patient expressed understanding and agreed to proceed. Subjective:  Catherine Paul is a 25 y.o. G1P0 at [redacted]w[redacted]d being seen today for ongoing prenatal care.  She is currently monitored for the following issues for this low-risk pregnancy and has Encounter for supervision of normal pregnancy, unspecified, first trimester; Sciatica without back pain; and Pelvic pain affecting pregnancy in second trimester, antepartum on their problem list.  Patient reports occasional contractions.  Contractions: Irritability. Vag. Bleeding: None.  Movement: Present. Denies any leaking of fluid.   The following portions of the patient's history were reviewed and updated as appropriate: allergies, current medications, past family history, past medical history, past social history, past surgical history and problem list.   Objective:   Vitals:   02/26/20 0838  BP: 121/85  Pulse: 88    Fetal Status:     Movement: Present     General:  Alert, oriented and cooperative. Patient is in no acute distress.  Respiratory: Normal respiratory effort, no problems with respiration noted  Mental Status: Normal mood and affect. Normal behavior. Normal judgment and thought content.  Rest of physical exam deferred due to type of encounter  Imaging: No results found.  Assessment and Plan:  Pregnancy: G1P0 at [redacted]w[redacted]d 1. Supervision of normal first pregnancy,  antepartum --Pt reports good fetal movement, denies cramping, LOF, or vaginal bleeding --Anticipatory guidance about next visits/weeks of pregnancy given. --Next visit in 2 weeks in office for GBS  2. Abdominal pain during pregnancy in third trimester --Yesterday pt had upper abdominal tightening x 30 minutes, became painful 6/10 but resolved after 30 minutes. --Increase PO fluids/Rest/ice/heat/warm bath/Tylenol/pregnancy support belt --PTL precautions reviewed  3. [redacted] weeks gestation of pregnancy  Preterm labor symptoms and general obstetric precautions including but not limited to vaginal bleeding, contractions, leaking of fluid and fetal movement were reviewed in detail with the patient. I discussed the assessment and treatment plan with the patient. The patient was provided an opportunity to ask questions and all were answered. The patient agreed with the plan and demonstrated an understanding of the instructions. The patient was advised to call back or seek an in-person office evaluation/go to MAU at [facility] for any urgent or concerning symptoms. Please refer to After Visit Summary for other counseling recommendations.   I provided 10 minutes of face-to-face time during this encounter.  Return in about 2 weeks (around 03/11/2020).  No future appointments.  Catherine Paul, [place] for [employer], [place]

## 2020-02-26 NOTE — Progress Notes (Signed)
Virtual ROB [redacted]w[redacted]d   CC: pt noted upper chest pain yesterday.

## 2020-03-03 ENCOUNTER — Other Ambulatory Visit (HOSPITAL_COMMUNITY)
Admission: RE | Admit: 2020-03-03 | Discharge: 2020-03-03 | Disposition: A | Discharge status: Home / Self Care | Payer: Medicaid Other | Source: Ambulatory Visit | Attending: Obstetrics and Gynecology | Admitting: Obstetrics and Gynecology

## 2020-03-03 ENCOUNTER — Other Ambulatory Visit: Payer: Self-pay

## 2020-03-03 ENCOUNTER — Ambulatory Visit (INDEPENDENT_AMBULATORY_CARE_PROVIDER_SITE_OTHER): Payer: Medicaid Other | Admitting: Obstetrics and Gynecology

## 2020-03-03 VITALS — BP 127/79 | HR 109 | Wt 191.0 lb

## 2020-03-03 DIAGNOSIS — Z3403 Encounter for supervision of normal first pregnancy, third trimester: Secondary | ICD-10-CM

## 2020-03-03 DIAGNOSIS — Z3493 Encounter for supervision of normal pregnancy, unspecified, third trimester: Secondary | ICD-10-CM | POA: Insufficient documentation

## 2020-03-03 DIAGNOSIS — Z3A36 36 weeks gestation of pregnancy: Secondary | ICD-10-CM | POA: Insufficient documentation

## 2020-03-03 NOTE — Patient Instructions (Signed)

## 2020-03-03 NOTE — Progress Notes (Signed)
Pt has had increase in cramping over the last few days.

## 2020-03-03 NOTE — Progress Notes (Signed)
   PRENATAL VISIT NOTE  Subjective:  Aviannah Castoro is a 25 y.o. G1P0 at [redacted]w[redacted]d being seen today for ongoing prenatal care.  She is currently monitored for the following issues for this low-risk pregnancy and has Encounter for supervision of normal first pregnancy in third trimester; Sciatica without back pain; and [redacted] weeks gestation of pregnancy on their problem list.  Patient doing well with no acute concerns today. She reports irregular cramping.  Contractions: Irritability.  .  Movement: Present. Denies leaking of fluid.   The following portions of the patient's history were reviewed and updated as appropriate: allergies, current medications, past family history, past medical history, past social history, past surgical history and problem list. Problem list updated.  Objective:   Vitals:   03/03/20 1338  BP: 127/79  Pulse: (!) 109  Weight: 191 lb (86.6 kg)    Fetal Status: Fetal Heart Rate (bpm): 155 Fundal Height: 36 cm Movement: Present     General:  Alert, oriented and cooperative. Patient is in no acute distress.  Skin: Skin is warm and dry. No rash noted.   Cardiovascular: Normal heart rate noted  Respiratory: Normal respiratory effort, no problems with respiration noted  Abdomen: Soft, gravid, appropriate for gestational age.  Pain/Pressure: Present     Pelvic: Cervical exam performed Dilation: Closed Effacement (%): 60 Station: -3  Extremities: Normal range of motion.     Mental Status:  Normal mood and affect. Normal behavior. Normal judgment and thought content.   Assessment and Plan:  Pregnancy: G1P0 at [redacted]w[redacted]d  1. [redacted] weeks gestation of pregnancy Routine prenatal care - Strep Gp B NAA - Cervicovaginal ancillary only( [place])  Preterm labor symptoms and general obstetric precautions including but not limited to vaginal bleeding, contractions, leaking of fluid and fetal movement were reviewed in detail with the patient.  Please refer to After Visit Summary for  other counseling recommendations.   Return in about 1 week (around 03/10/2020) for ROB, in person.   Lynnda Shields, MD

## 2020-03-04 LAB — CERVICOVAGINAL ANCILLARY ONLY
Chlamydia: NEGATIVE
Comment: NEGATIVE
Comment: NORMAL
Neisseria Gonorrhea: NEGATIVE

## 2020-03-05 LAB — STREP GP B NAA: Strep Gp B NAA: NEGATIVE

## 2020-03-11 ENCOUNTER — Other Ambulatory Visit: Payer: Self-pay

## 2020-03-11 ENCOUNTER — Ambulatory Visit (INDEPENDENT_AMBULATORY_CARE_PROVIDER_SITE_OTHER): Payer: Medicaid Other | Admitting: Advanced Practice Midwife

## 2020-03-11 VITALS — BP 131/86 | HR 109 | Wt 185.0 lb

## 2020-03-11 DIAGNOSIS — Z3403 Encounter for supervision of normal first pregnancy, third trimester: Secondary | ICD-10-CM

## 2020-03-11 DIAGNOSIS — R030 Elevated blood-pressure reading, without diagnosis of hypertension: Secondary | ICD-10-CM

## 2020-03-11 DIAGNOSIS — O99891 Other specified diseases and conditions complicating pregnancy: Secondary | ICD-10-CM

## 2020-03-11 DIAGNOSIS — M7918 Myalgia, other site: Secondary | ICD-10-CM

## 2020-03-11 NOTE — Patient Instructions (Signed)
Things to Try After 37 weeks to Encourage Labor/Get Ready for Labor:   1.  Try the [employer] at [email or web address] daily to improve baby's position and encourage the onset of labor.  2. Walk a little and rest a little every day.  Change positions often.  3. Cervical Ripening: May try one or both a. Red Raspberry Leaf capsules or tea:  two 300mg  or 400mg  tablets with each meal, 2-3 times a day, or 1-3 cups of tea daily  Potential Side Effects Of Raspberry Leaf:  Most women do not experience any side effects from drinking raspberry leaf tea. However, nausea and loose stools are possible   b. Evening Primrose Oil capsules: take 1 capsule by mouth and place one capsule in the vagina every night.    Some of the potential side effects:  Upset stomach  Loose stools or diarrhea  Headaches  Nausea  4. Sex can also help the cervix ripen and encourage labor onset.    Labor Precautions Reasons to come to MAU at [facility] and [place]:  1.  Contractions are  5 minutes apart or less, each last 1 minute, these have been going on for 1-2 hours, and you cannot walk or talk during them 2.  You have a large gush of fluid, or a trickle of fluid that will not stop and you have to wear a pad 3.  You have bleeding that is bright red, heavier than spotting--like menstrual bleeding (spotting can be normal in early labor or after a check of your cervix) 4.  You do not feel the baby moving like he/she normally does

## 2020-03-11 NOTE — Progress Notes (Signed)
   PRENATAL VISIT NOTE  Subjective:  Catherine Paul is a 25 y.o. G1P0 at [redacted]w[redacted]d being seen today for ongoing prenatal care.  She is currently monitored for the following issues for this low-risk pregnancy and has Encounter for supervision of normal first pregnancy in third trimester; Sciatica without back pain; and [redacted] weeks gestation of pregnancy on their problem list.  Patient reports occasional contractions.  Contractions: Irregular. Vag. Bleeding: None.  Movement: Present. Denies leaking of fluid.   The following portions of the patient's history were reviewed and updated as appropriate: allergies, current medications, past family history, past medical history, past social history, past surgical history and problem list.   Objective:   Vitals:   03/11/20 1036 03/11/20 1037  BP: (!) 135/91 131/86  Pulse: (!) 109   Weight: 185 lb (83.9 kg)    Retake BP 131/86   Fetal Status: Fetal Heart Rate (bpm): 149 Fundal Height: 37 cm Movement: Present  Presentation: Vertex  General:  Alert, oriented and cooperative. Patient is in no acute distress.  Skin: Skin is warm and dry. No rash noted.   Cardiovascular: Normal heart rate noted  Respiratory: Normal respiratory effort, no problems with respiration noted  Abdomen: Soft, gravid, appropriate for gestational age.  Pain/Pressure: Present     Pelvic: Cervical exam performed in the presence of a chaperone Dilation: 1.5 Effacement (%): 60 Station: -2  Extremities: Normal range of motion.  Edema: Trace  Mental Status: Normal mood and affect. Normal behavior. Normal judgment and thought content.   Assessment and Plan:  Pregnancy: G1P0 at [redacted]w[redacted]d 1. Encounter for supervision of normal first pregnancy in third trimester --Anticipatory guidance about next visits/weeks of pregnancy given. --Next visit in 1 week in office  2. Pain in symphysis pubis during pregnancy --Rest/ice/heat/warm bath/Tylenol/pregnancy support belt --Letter provided for pt to  limit work hours, will evaluate next week if needs to come out of work early.   3. Elevated blood pressure reading in office without diagnosis of hypertension --BP 135/91 on intake, then 131/86 on retake, no hx HTN and BP mostly high 120s/130s over 80s during pregnancy --No s/sx of PEC today --Pt to take BP daily/every other day on home cuff, report BPs of >140/90.   --S/sx of PEC /reasons to see care reviewed  Term labor symptoms and general obstetric precautions including but not limited to vaginal bleeding, contractions, leaking of fluid and fetal movement were reviewed in detail with the patient. Please refer to After Visit Summary for other counseling recommendations.   Return in about 1 week (around 03/18/2020).  Future Appointments  Date Time Provider [place]  03/18/2020  8:55 AM Virginia Rochester, NP [place] None  03/25/2020 11:15 AM [place], Kathie Dike, CNM [facility] None    Fatima Blank, CNM

## 2020-03-11 NOTE — Progress Notes (Signed)
+   Fetal movement. Pt c/o increased pain in the hips and lower back.

## 2020-03-13 ENCOUNTER — Other Ambulatory Visit: Payer: Self-pay

## 2020-03-13 ENCOUNTER — Inpatient Hospital Stay (HOSPITAL_COMMUNITY): Payer: Medicaid Other | Admitting: Anesthesiology

## 2020-03-13 ENCOUNTER — Inpatient Hospital Stay (HOSPITAL_COMMUNITY)
Admission: AD | Admit: 2020-03-13 | Discharge: 2020-03-15 | DRG: 807 | Disposition: A | Discharge status: Home / Self Care | Payer: Medicaid Other | Attending: Obstetrics and Gynecology | Admitting: Obstetrics and Gynecology

## 2020-03-13 ENCOUNTER — Encounter (HOSPITAL_COMMUNITY): Payer: Self-pay | Admitting: Obstetrics & Gynecology

## 2020-03-13 DIAGNOSIS — M5430 Sciatica, unspecified side: Secondary | ICD-10-CM | POA: Diagnosis present

## 2020-03-13 DIAGNOSIS — O134 Gestational [pregnancy-induced] hypertension without significant proteinuria, complicating childbirth: Principal | ICD-10-CM | POA: Diagnosis present

## 2020-03-13 DIAGNOSIS — Z3A37 37 weeks gestation of pregnancy: Secondary | ICD-10-CM

## 2020-03-13 DIAGNOSIS — Z23 Encounter for immunization: Secondary | ICD-10-CM

## 2020-03-13 DIAGNOSIS — O4593 Premature separation of placenta, unspecified, third trimester: Secondary | ICD-10-CM | POA: Diagnosis present

## 2020-03-13 DIAGNOSIS — O133 Gestational [pregnancy-induced] hypertension without significant proteinuria, third trimester: Secondary | ICD-10-CM

## 2020-03-13 DIAGNOSIS — O99892 Other specified diseases and conditions complicating childbirth: Secondary | ICD-10-CM | POA: Diagnosis present

## 2020-03-13 DIAGNOSIS — Z20822 Contact with and (suspected) exposure to covid-19: Secondary | ICD-10-CM | POA: Diagnosis present

## 2020-03-13 DIAGNOSIS — R030 Elevated blood-pressure reading, without diagnosis of hypertension: Secondary | ICD-10-CM | POA: Diagnosis present

## 2020-03-13 DIAGNOSIS — O139 Gestational [pregnancy-induced] hypertension without significant proteinuria, unspecified trimester: Secondary | ICD-10-CM | POA: Diagnosis present

## 2020-03-13 LAB — TYPE AND SCREEN
ABO/RH(D): O POS
Antibody Screen: NEGATIVE

## 2020-03-13 LAB — COMPREHENSIVE METABOLIC PANEL
ALT: 10 U/L (ref 0–44)
AST: 18 U/L (ref 15–41)
Albumin: 2.8 g/dL — ABNORMAL LOW (ref 3.5–5.0)
Alkaline Phosphatase: 158 U/L — ABNORMAL HIGH (ref 38–126)
Anion gap: 12 (ref 5–15)
BUN: 5 mg/dL — ABNORMAL LOW (ref 6–20)
CO2: 17 mmol/L — ABNORMAL LOW (ref 22–32)
Calcium: 8.9 mg/dL (ref 8.9–10.3)
Chloride: 106 mmol/L (ref 98–111)
Creatinine, Ser: 0.45 mg/dL (ref 0.44–1.00)
GFR, Estimated: >60 mL/min (ref >60)
Glucose, Bld: 88 mg/dL (ref 70–99)
Potassium: 3.6 mmol/L (ref 3.5–5.1)
Sodium: 135 mmol/L (ref 135–145)
Total Bilirubin: 0.7 mg/dL (ref 0.3–1.2)
Total Protein: 6.2 g/dL — ABNORMAL LOW (ref 6.5–8.1)

## 2020-03-13 LAB — RESP PANEL BY RT-PCR (FLU A&B, COVID) ARPGX2
Influenza A by PCR: NEGATIVE
Influenza B by PCR: NEGATIVE
SARS Coronavirus 2 by RT PCR: NEGATIVE

## 2020-03-13 LAB — CBC
HCT: 36 % (ref 36.0–46.0)
HCT: 35.6 % — ABNORMAL LOW (ref 36.0–46.0)
HCT: 36.7 % (ref 36.0–46.0)
Hemoglobin: 12.1 g/dL (ref 12.0–15.0)
Hemoglobin: 11.9 g/dL — ABNORMAL LOW (ref 12.0–15.0)
Hemoglobin: 12.1 g/dL (ref 12.0–15.0)
MCH: 28.7 pg (ref 26.0–34.0)
MCH: 28.6 pg (ref 26.0–34.0)
MCH: 28.5 pg (ref 26.0–34.0)
MCHC: 33.6 g/dL (ref 30.0–36.0)
MCHC: 33.4 g/dL (ref 30.0–36.0)
MCHC: 33 g/dL (ref 30.0–36.0)
MCV: 85.5 fL (ref 80.0–100.0)
MCV: 85.6 fL (ref 80.0–100.0)
MCV: 86.4 fL (ref 80.0–100.0)
Platelets: 230 10*3/uL (ref 150–400)
Platelets: 228 10*3/uL (ref 150–400)
Platelets: 230 10*3/uL (ref 150–400)
RBC: 4.21 MIL/uL (ref 3.87–5.11)
RBC: 4.16 MIL/uL (ref 3.87–5.11)
RBC: 4.25 MIL/uL (ref 3.87–5.11)
RDW: 13.4 % (ref 11.5–15.5)
RDW: 13.5 % (ref 11.5–15.5)
RDW: 13.6 % (ref 11.5–15.5)
WBC: 12.3 10*3/uL — ABNORMAL HIGH (ref 4.0–10.5)
WBC: 13.7 10*3/uL — ABNORMAL HIGH (ref 4.0–10.5)
WBC: 19.4 10*3/uL — ABNORMAL HIGH (ref 4.0–10.5)
nRBC: 0 % (ref 0.0–0.2)
nRBC: 0 % (ref 0.0–0.2)
nRBC: 0 % (ref 0.0–0.2)

## 2020-03-13 LAB — PROTEIN / CREATININE RATIO, URINE
Creatinine, Urine: 40.44 mg/dL
Protein Creatinine Ratio: 0.25 mg/mg{Cre} — ABNORMAL HIGH (ref 0.00–0.15)
Total Protein, Urine: 10 mg/dL

## 2020-03-13 LAB — RPR: RPR Ser Ql: NONREACTIVE

## 2020-03-13 MED ORDER — PHENYLEPHRINE 40 MCG/ML (10ML) SYRINGE FOR IV PUSH (FOR BLOOD PRESSURE SUPPORT): 80.0000 ug | PREFILLED_SYRINGE | INTRAVENOUS | Status: DC | PRN

## 2020-03-13 MED ORDER — SIMETHICONE 80 MG PO CHEW: 80.0000 mg | CHEWABLE_TABLET | ORAL | Status: DC | PRN

## 2020-03-13 MED ORDER — ACETAMINOPHEN 325 MG PO TABS
650.0000 mg | ORAL_TABLET | Freq: Four times a day (QID) | ORAL | Status: DC
  Administered 2020-03-13 – 2020-03-15 (×6): 650 mg via ORAL
  Filled 2020-03-13 (×6): qty 2

## 2020-03-13 MED ORDER — BENZOCAINE-MENTHOL 20-0.5 % EX AERO
1.0000 "application " | INHALATION_SPRAY | CUTANEOUS | Status: DC | PRN
  Administered 2020-03-14: 1 via TOPICAL
  Filled 2020-03-13: qty 56

## 2020-03-13 MED ORDER — TERBUTALINE SULFATE 1 MG/ML IJ SOLN: 0.2500 mg | Freq: Once | INTRAMUSCULAR | Status: DC | PRN

## 2020-03-13 MED ORDER — LACTATED RINGERS IV SOLN
500.0000 mL | Freq: Once | INTRAVENOUS | Status: AC
  Administered 2020-03-13: 500 mL via INTRAVENOUS

## 2020-03-13 MED ORDER — FENTANYL CITRATE (PF) 100 MCG/2ML IJ SOLN: 100.0000 ug | Freq: Once | INTRAMUSCULAR | Status: AC

## 2020-03-13 MED ORDER — LIDOCAINE HCL (PF) 1 % IJ SOLN
INTRAMUSCULAR | Status: DC | PRN
  Administered 2020-03-13 (×2): 4 mL via EPIDURAL

## 2020-03-13 MED ORDER — DIBUCAINE (PERIANAL) 1 % EX OINT: 1.0000 "application " | TOPICAL_OINTMENT | CUTANEOUS | Status: DC | PRN

## 2020-03-13 MED ORDER — LIDOCAINE HCL (PF) 1 % IJ SOLN: 30.0000 mL | INTRAMUSCULAR | Status: DC | PRN

## 2020-03-13 MED ORDER — ONDANSETRON HCL 4 MG PO TABS: 4.0000 mg | ORAL_TABLET | ORAL | Status: DC | PRN

## 2020-03-13 MED ORDER — PRENATAL MULTIVITAMIN CH
1.0000 | ORAL_TABLET | Freq: Every day | ORAL | Status: DC
  Administered 2020-03-14 – 2020-03-15 (×2): 1 via ORAL
  Filled 2020-03-13 (×2): qty 1

## 2020-03-13 MED ORDER — OXYCODONE-ACETAMINOPHEN 5-325 MG PO TABS: 2.0000 | ORAL_TABLET | ORAL | Status: DC | PRN

## 2020-03-13 MED ORDER — OXYTOCIN-SODIUM CHLORIDE 30-0.9 UT/500ML-% IV SOLN
2.5000 [IU]/h | INTRAVENOUS | Status: DC
Start: 2020-03-13 — End: 2020-03-13
  Administered 2020-03-13: 2.5 [IU]/h via INTRAVENOUS
  Filled 2020-03-13: qty 500

## 2020-03-13 MED ORDER — LACTATED RINGERS IV SOLN: 500.0000 mL | INTRAVENOUS | Status: DC | PRN

## 2020-03-13 MED ORDER — FENTANYL-BUPIVACAINE-NACL 0.5-0.125-0.9 MG/250ML-% EP SOLN
EPIDURAL | Status: DC | PRN
  Administered 2020-03-13: 12 mL/h via EPIDURAL

## 2020-03-13 MED ORDER — EPHEDRINE 5 MG/ML INJ: 10.0000 mg | INTRAVENOUS | Status: DC | PRN

## 2020-03-13 MED ORDER — WITCH HAZEL-GLYCERIN EX PADS: 1.0000 "application " | MEDICATED_PAD | CUTANEOUS | Status: DC | PRN

## 2020-03-13 MED ORDER — OXYCODONE-ACETAMINOPHEN 5-325 MG PO TABS: 1.0000 | ORAL_TABLET | ORAL | Status: DC | PRN

## 2020-03-13 MED ORDER — ACETAMINOPHEN 325 MG PO TABS: 650.0000 mg | ORAL_TABLET | ORAL | Status: DC | PRN

## 2020-03-13 MED ORDER — METRONIDAZOLE 500 MG PO TABS
500.0000 mg | ORAL_TABLET | Freq: Two times a day (BID) | ORAL | Status: DC
  Filled 2020-03-13: qty 1

## 2020-03-13 MED ORDER — DIPHENHYDRAMINE HCL 50 MG/ML IJ SOLN
12.5000 mg | INTRAMUSCULAR | Status: DC | PRN
Start: 2020-03-13 — End: 2020-03-13

## 2020-03-13 MED ORDER — INFLUENZA VAC SPLIT QUAD 0.5 ML IM SUSY
0.5000 mL | PREFILLED_SYRINGE | INTRAMUSCULAR | Status: AC
  Administered 2020-03-15: 0.5 mL via INTRAMUSCULAR
  Filled 2020-03-13: qty 0.5

## 2020-03-13 MED ORDER — SENNOSIDES-DOCUSATE SODIUM 8.6-50 MG PO TABS
2.0000 | ORAL_TABLET | Freq: Every day | ORAL | Status: DC
  Administered 2020-03-15: 2 via ORAL
  Filled 2020-03-13: qty 2

## 2020-03-13 MED ORDER — LACTATED RINGERS AMNIOINFUSION: INTRAVENOUS | Status: DC

## 2020-03-13 MED ORDER — OXYTOCIN BOLUS FROM INFUSION
333.0000 mL | Freq: Once | INTRAVENOUS | Status: AC
  Administered 2020-03-13: 333 mL via INTRAVENOUS

## 2020-03-13 MED ORDER — LACTATED RINGERS IV SOLN: INTRAVENOUS | Status: DC

## 2020-03-13 MED ORDER — FENTANYL CITRATE (PF) 100 MCG/2ML IJ SOLN
INTRAMUSCULAR | Status: AC
  Administered 2020-03-13: 100 ug via INTRAVENOUS
  Filled 2020-03-13: qty 2

## 2020-03-13 MED ORDER — DIPHENHYDRAMINE HCL 25 MG PO CAPS: 25.0000 mg | ORAL_CAPSULE | Freq: Four times a day (QID) | ORAL | Status: DC | PRN

## 2020-03-13 MED ORDER — FENTANYL-BUPIVACAINE-NACL 0.5-0.125-0.9 MG/250ML-% EP SOLN
12.0000 mL/h | EPIDURAL | Status: DC | PRN
  Filled 2020-03-13: qty 250

## 2020-03-13 MED ORDER — ONDANSETRON HCL 4 MG/2ML IJ SOLN: 4.0000 mg | Freq: Four times a day (QID) | INTRAMUSCULAR | Status: DC | PRN

## 2020-03-13 MED ORDER — OXYTOCIN-SODIUM CHLORIDE 30-0.9 UT/500ML-% IV SOLN
1.0000 m[IU]/min | INTRAVENOUS | Status: DC
  Administered 2020-03-13: 2 m[IU]/min via INTRAVENOUS

## 2020-03-13 MED ORDER — SOD CITRATE-CITRIC ACID 500-334 MG/5ML PO SOLN: 30.0000 mL | ORAL | Status: DC | PRN

## 2020-03-13 MED ORDER — IBUPROFEN 600 MG PO TABS
600.0000 mg | ORAL_TABLET | Freq: Four times a day (QID) | ORAL | Status: DC
  Administered 2020-03-14 – 2020-03-15 (×7): 600 mg via ORAL
  Filled 2020-03-13 (×7): qty 1

## 2020-03-13 MED ORDER — MISOPROSTOL 50MCG HALF TABLET
50.0000 ug | ORAL_TABLET | ORAL | Status: DC | PRN
  Administered 2020-03-13: 50 ug via BUCCAL
  Filled 2020-03-13: qty 1

## 2020-03-13 MED ORDER — ONDANSETRON HCL 4 MG/2ML IJ SOLN: 4.0000 mg | INTRAMUSCULAR | Status: DC | PRN

## 2020-03-13 MED ORDER — TETANUS-DIPHTH-ACELL PERTUSSIS 5-2.5-18.5 LF-MCG/0.5 IM SUSY: 0.5000 mL | PREFILLED_SYRINGE | Freq: Once | INTRAMUSCULAR | Status: DC

## 2020-03-13 MED ORDER — COCONUT OIL OIL
1.0000 "application " | TOPICAL_OIL | Status: DC | PRN
  Administered 2020-03-14: 1 via TOPICAL

## 2020-03-13 NOTE — MAU Note (Signed)
Pt reports upper abd pain today off/on. B/P at home 120's/90's. Denies contractions, bleeding or ROM. Reports good fetal movement.

## 2020-03-13 NOTE — Lactation Note (Signed)
This note was copied from a baby's chart. Lactation Consultation Note  Patient Name: Catherine Paul HTXHF'[id number] Date: 03/13/2020 Reason for consult: L&D Initial assessment;Mother's request;Primapara;1st time breastfeeding;Early term 37-38.6wks;Other (Comment) (GHTN) Age:24 hours  Maternal Data Has patient been taught Hand Expression?: Yes Does the patient have breastfeeding experience prior to this delivery?: No   LC reviewed with Mom feeding cues. Mom asking for assistance with latch. Infant latched on the left side with signs of milk transfer. Mom to receive further lactation support on the floor.   Feeding Mother's Current Feeding Choice: Breast Milk  LATCH Score Latch: Repeated attempts needed to sustain latch, nipple held in mouth throughout feeding, stimulation needed to elicit sucking reflex.  Audible Swallowing: Spontaneous and intermittent  Type of Nipple: Everted at rest and after stimulation  Comfort (Breast/Nipple): Soft / non-tender  Hold (Positioning): Assistance needed to correctly position infant at breast and maintain latch.  LATCH Score: 8   Lactation Tools Discussed/Used    Interventions Interventions: Breast feeding basics reviewed;Assisted with latch;Support pillows;Position options;Education;Skin to skin;Expressed milk;Hand express;Breast compression;Adjust position  Discharge    Consult Status Consult Status: Follow-up Date: 03/14/20 Follow-up type: In-patient    Stehanie Ekstrom  [place] 03/13/2020, 6:55 PM

## 2020-03-13 NOTE — Discharge Summary (Addendum)
Postpartum Discharge Summary    Patient Name: Catherine Paul DOB: 02/23/1995 MRN: [id number]  Date of admission: 03/13/2020 Delivery date:03/13/2020  Delivering provider: Randa Ngo  Date of discharge: 03/15/2020  Admitting diagnosis: Gestational hypertension [O13.9] Intrauterine pregnancy: [redacted]w[redacted]d    Secondary diagnosis:  Active Problems:   Sciatica without back pain   Gestational hypertension   Vacuum-assisted vaginal delivery  Additional problems: as noted above   Discharge diagnosis: Term Vacuum-Assisted Vaginal Delivery                                        Post partum procedures:none Augmentation: AROM, Pitocin, Cytotec and IP Foley Complications: vaginal bleeding in second stage (c/f partial abruption)  Hospital course: Induction of Labor With Vaginal Delivery   25y.o. yo G1P1001 at 3101w5das admitted to the hospital 03/13/2020 for induction of labor.  Indication for induction: gHTN. Labor progressed appropriately; however, pt developed notable vaginal bleeding with corresponding non-reassuring fetal heart tones in second stage, concerning for partial placental abruption. Therefore, pt was consented for vacuum-assisted vaginal delivery. Patient had a labor course as follows: Membrane Rupture Time/Date: 1:57 PM ,03/13/2020   Delivery Method:Vaginal, Vacuum (Extractor)  Episiotomy: None  Lacerations:  2nd degree;Perineal;Labial  Details of delivery can be found in separate delivery note. Patient had a routine postpartum course. Patient is discharged home 03/15/20.  Newborn Data: Birth date:03/13/2020  Birth time:5:55 PM  Gender:Female  Living status:Living  Apgars:8 ,9  Weight:3160 g   Magnesium Sulfate received: No BMZ received: No Rhophylac:N/A MMR:N/A T-DaP:Given prenatally Flu: offered prior to discharge Transfusion:No  Physical exam  Vitals:   03/14/20 0523 03/14/20 1005 03/14/20 2121 03/15/20 0400  BP: 121/73 123/76 119/74 119/80  Pulse: 82 86 79 83   Resp: 18 16 15 18   Temp: 97.7 F (36.5 C) 98.6 F (37 C) 97.8 F (36.6 C) 98.5 F (36.9 C)  TempSrc: Oral Oral Oral Oral  SpO2: 99% 99% 99% 99%  Weight:      Height:       General: alert, cooperative and no distress Lochia: appropriate Uterine Fundus: firm Incision: N/A DVT Evaluation: No evidence of DVT seen on physical exam. No cords or calf tenderness. No significant calf/ankle edema. Labs: Lab Results  Component Value Date   WBC 19.4 (H) 03/13/2020   HGB 12.1 03/13/2020   HCT 36.7 03/13/2020   MCV 86.4 03/13/2020   PLT 230 03/13/2020   CMP Latest Ref Rng & Units 03/13/2020  Glucose 70 - 99 mg/dL 88  BUN 6 - 20 mg/dL 5(L)  Creatinine 0.44 - 1.00 mg/dL 0.45  Sodium 135 - 145 mmol/L 135  Potassium 3.5 - 5.1 mmol/L 3.6  Chloride 98 - 111 mmol/L 106  CO2 22 - 32 mmol/L 17(L)  Calcium 8.9 - 10.3 mg/dL 8.9  Total Protein 6.5 - 8.1 g/dL 6.2(L)  Total Bilirubin 0.3 - 1.2 mg/dL 0.7  Alkaline Phos 38 - 126 U/L 158(H)  AST 15 - 41 U/L 18  ALT 0 - 44 U/L 10   Edinburgh Score: Edinburgh Postnatal Depression Scale Screening Tool 03/14/2020  I have been able to laugh and see the funny side of things. 0  I have looked forward with enjoyment to things. 0  I have blamed myself unnecessarily when things went wrong. 0  I have been anxious or worried for no good reason. 0  I have felt scared  or panicky for no good reason. 0  Things have been getting on top of me. 1  I have been so unhappy that I have had difficulty sleeping. 0  I have felt sad or miserable. 0  I have been so unhappy that I have been crying. 0  The thought of harming myself has occurred to me. 0  Edinburgh Postnatal Depression Scale Total 1     After visit meds:  Allergies as of 03/15/2020   No Known Allergies     Medication List    STOP taking these medications   Blood Pressure Monitor Kit   cyclobenzaprine 5 MG tablet Commonly known as: FLEXERIL   Misc. Devices Misc     TAKE these medications    acetaminophen 325 MG tablet Commonly known as: TYLENOL Take 650 mg by mouth every 6 (six) hours as needed for mild pain or headache.   coconut oil Oil Apply 1 application topically as needed (nipple pain).   ibuprofen 600 MG tablet Commonly known as: ADVIL Take 1 tablet (600 mg total) by mouth every 8 (eight) hours as needed for fever, headache, mild pain, moderate pain or cramping.   norethindrone 0.35 MG tablet Commonly known as: Ortho Micronor Take 1 tablet (0.35 mg total) by mouth daily.   Vitafol Gummies 3.33-0.333-34.8 MG Chew Chew 3 tablets by mouth daily.        Discharge home in stable condition Infant Feeding: Breast Infant Disposition:home with mother Discharge instruction: per After Visit Summary and Postpartum booklet. Activity: Advance as tolerated. Pelvic rest for 6 weeks.  Diet: routine diet Future Appointments: Future Appointments  Date Time Provider [place]  03/18/2020  8:55 AM Virginia Rochester, NP [facility] None  03/25/2020 11:15 AM Leftwich-Kirby, Kathie Dike, CNM [facility] None   Follow up Visit: Message sent to [facility] on 03/14/20 to schedule PP appt.  Please schedule this patient for a In person postpartum visit in 6 weeks with the following provider: Any provider. Additional Postpartum F/U:BP check 1 week  High risk pregnancy complicated by: gHTN Delivery mode:  Vaginal, Vacuum Neurosurgeon)  Anticipated Birth Control:  POPs  Alyza Artiaga, Gildardo Cranker, MD OB Fellow, [facility] 03/15/2020 8:56 AM

## 2020-03-13 NOTE — Progress Notes (Signed)
Micaylah Bertucci is a 25 y.o. G1P0 at [redacted]w[redacted]d admitted for induction of labor due to new gHTN.  Subjective: Comfortable with epidural.  No other concerns.  Discussed AROM and she is agreeable.    Objective: BP 125/73   Pulse 96   Temp 98.6 F (37 C) (Oral)   Resp 16   Ht 5\' 3"  (1.6 m)   Wt 84.6 kg   LMP 06/27/2019   SpO2 99%   BMI 33.02 kg/m  No intake/output data recorded.  FHT:  FHR: 130 bpm, variability: moderate,  accelerations:  Present,  decelerations:  Recurrent variable decels (after AROM) UC:   regular, every 2-3 minutes  SVE:   Dilation: 8 Effacement (%): 80 Station: -1 Exam by:: Dr. Nyoka Cowden  Pitocin @ 4 mu/min  Labs: Lab Results  Component Value Date   WBC 13.7 (H) 03/13/2020   HGB 11.9 (L) 03/13/2020   HCT 35.6 (L) 03/13/2020   MCV 85.6 03/13/2020   PLT 228 03/13/2020    Assessment / Plan: Rashawnda Gaba is a 25 y.o. G1P0 at [redacted]w[redacted]d admitted for induction of labor due to new gHTN.  #Labor: S/p Cytotec x1, FB.  Pit started at 1130.  AROM this check with large amount of clear fluid.  Subsequently, started having recurrent variable decels despite repositioning, IUPC placed and amnioinfusion started (300 cc bolus, then 100 cc/hr).  Confirmed cord had not prolapsed.  FSE placed as well.  #Fetal wellbeing:  Category II due to recurrent variables after AROM, as above.  Still with accels and great variability.  Decels now looking more like early decels, which is reassuring.  Pain Control:  Epidural  #gHTN: No severe range BP.  No e/o pre-e on labs.  Now normotensive.    #I/D:  GBS negative  #Anticipated MOD:  SVD  Makhai Fulco Madelin Headings, MD 03/13/2020, 2:54 PM

## 2020-03-13 NOTE — Anesthesia Preprocedure Evaluation (Signed)
Anesthesia Evaluation  Patient identified by MRN, date of birth, ID band Patient awake    Reviewed: Allergy & Precautions, Patient's Chart, lab work & pertinent test results  History of Anesthesia Complications Negative for: history of anesthetic complications  Airway Mallampati: II  TM Distance: >3 FB Neck ROM: Full    Dental no notable dental hx.    Pulmonary neg pulmonary ROS,    Pulmonary exam normal        Cardiovascular negative cardio ROS Normal cardiovascular exam     Neuro/Psych negative neurological ROS  negative psych ROS   GI/Hepatic negative GI ROS, Neg liver ROS,   Endo/Other  negative endocrine ROS  Renal/GU negative Renal ROS  negative genitourinary   Musculoskeletal negative musculoskeletal ROS (+)   Abdominal   Peds  Hematology negative hematology ROS (+)   Anesthesia Other Findings Day of surgery medications reviewed with patient.  Reproductive/Obstetrics (+) Pregnancy (gHTN)                             Anesthesia Physical Anesthesia Plan  ASA: II  Anesthesia Plan: Epidural   Post-op Pain Management:    Induction:   PONV Risk Score and Plan: Treatment may vary due to age or medical condition  Airway Management Planned: Natural Airway  Additional Equipment:   Intra-op Plan:   Post-operative Plan:   Informed Consent: I have reviewed the patients History and Physical, chart, labs and discussed the procedure including the risks, benefits and alternatives for the proposed anesthesia with the patient or authorized representative who has indicated his/her understanding and acceptance.       Plan Discussed with:   Anesthesia Plan Comments:         Anesthesia Quick Evaluation

## 2020-03-13 NOTE — Progress Notes (Signed)
Patient ID: Catherine Paul, female   DOB: October 17, 1995, 25 y.o.   MRN: [id number]   Due to logistics, pt remained in the MAU most of the night; s/p cytotec x 1st dose at 0600  BP 108/61, P 78 FHR 130-140s, +accels, no decels Ctx irreg, mild Cx 1-2/60/vtx -2  IUP@37 .5wks gHTN- stable w mild range elevations Cx unfavorable  Cervical foley placed without difficulty and inflated with 60cc fluid Once it comes out, plan on Pitocin most likely; continue cytotec q 4h Anticipate vag del  Catherine Paul [facility] 03/13/2020

## 2020-03-13 NOTE — MAU Provider Note (Addendum)
Chief Complaint:  Abdominal Pain and Hypertension   Event Date/Time   First Provider Initiated Contact with Patient 03/13/20 0053     HPI: Catherine Paul is a 25 y.o. G1P0 at 101w5dho presents to maternity admissions reporting elevated Blood Pressures at home and RUQ/Epigastric pain.  Had one day with elevated BPs in office 2 days ago. . She reports good fetal movement, denies LOF, vaginal bleeding, vaginal itching/burning, urinary symptoms, h/a, dizziness, n/v, diarrhea, constipation or fever/chills.  She denies headache, visual changes.  Office BP  03/11/20 1036 03/11/20 1037  BP: (!) 135/91 131/86  Pulse: (!) 109   Weight: 185 lb (83.9 kg      Abdominal Pain This is a new problem. The current episode started today. The onset quality is gradual. The problem occurs intermittently. The problem has been unchanged. The pain is located in the epigastric region and RUQ. The quality of the pain is dull. The abdominal pain does not radiate. Pertinent negatives include no constipation, diarrhea, dysuria, fever, headaches, myalgias, nausea or vomiting. Nothing aggravates the pain. The pain is relieved by nothing.  Hypertension This is a new problem. The current episode started 1 to 4 weeks ago. The problem has been waxing and waning since onset. Pertinent negatives include no anxiety, blurred vision, chest pain, headaches, peripheral edema or shortness of breath. There are no associated agents to hypertension. Past treatments include nothing. There are no compliance problems.     RN Note: Pt reports upper abd pain today off/on. B/P at home 120's/90's. Denies contractions, bleeding or ROM. Reports good fetal movement.    Past Medical History: History reviewed. No pertinent past medical history.  Past obstetric history: OB History  Gravida Para Term Preterm AB Living  1            SAB IAB Ectopic Multiple Live Births               # Outcome Date GA Lbr Len/2nd Weight Sex Delivery Anes PTL Lv   1 Current             Past Surgical History: Past Surgical History:  Procedure Laterality Date  . NO PAST SURGERIES      Family History: Family History  Problem Relation Age of Onset  . Diabetes Maternal Grandmother   . Hypertension Maternal Grandmother   . Hypertension Maternal Grandfather   . Stroke Maternal Grandfather   . Hypertension Paternal Grandmother     Social History: Social History   Tobacco Use  . Smoking status: Never Smoker  . Smokeless tobacco: Never Used  Vaping Use  . Vaping Use: Never used  Substance Use Topics  . Alcohol use: Not Currently  . Drug use: Never    Allergies: No Known Allergies  Meds:  Medications Prior to Admission  Medication Sig Dispense Refill Last Dose  . cyclobenzaprine (FLEXERIL) 5 MG tablet Take 1-2 tablets (5-10 mg total) by mouth 3 (three) times daily as needed for muscle spasms. 30 tablet 0 Past Month at Unknown time  . metroNIDAZOLE (FLAGYL) 500 MG tablet Take 1 tablet (500 mg total) by mouth 2 (two) times daily. 14 tablet 0 Past Month at Unknown time  . Prenatal Vit-Fe Phos-FA-Omega (VITAFOL GUMMIES) 3.33-0.333-34.8 MG CHEW Chew 3 tablets by mouth daily. 90 tablet 11 03/12/2020 at Unknown time  . Blood Pressure Monitor KIT 1 kit by Does not apply route once a week. 1 kit 0   . Misc. Devices MISC Dispense one maternity belt for patient 1  each 0   . terconazole (TERAZOL 7) 0.4 % vaginal cream Place 1 applicator vaginally at bedtime. (Patient not taking: No sig reported) 45 g 0     I have reviewed patient's Past Medical Hx, Surgical Hx, Family Hx, Social Hx, medications and allergies.   ROS:  Review of Systems  Constitutional: Negative for fever.  Eyes: Negative for blurred vision.  Respiratory: Negative for shortness of breath.   Cardiovascular: Negative for chest pain.  Gastrointestinal: Positive for abdominal pain. Negative for constipation, diarrhea, nausea and vomiting.  Genitourinary: Negative for dysuria.   Musculoskeletal: Negative for myalgias.  Neurological: Negative for headaches.   Other systems negative  Physical Exam   Patient Vitals for the past 24 hrs:  BP Temp Temp src Pulse Resp SpO2 Height Weight  03/13/20 0047 126/81 97.8 F (36.6 C) Oral 89 17 98 % 5' 3"  (1.6 m) 84.6 kg   Vitals:   03/13/20 0100 03/13/20 0115 03/13/20 0130 03/13/20 0145  BP: 129/90 123/84 124/83 (!) 123/92  Pulse: (!) 103 (!) 102 100 (!) 108  Resp:      Temp:      TempSrc:      SpO2: 98% 99% 98% 98%  Weight:      Height:        Constitutional: Well-developed, well-nourished female in no acute distress.  Cardiovascular: normal rate and rhythm Respiratory: normal effort, clear to auscultation bilaterally GI: Abd soft, non-tender, gravid appropriate for gestational age.   No rebound or guarding. MS: Extremities nontender, no edema, normal ROM Neurologic: Alert and oriented x 4.   DTRs 2+ with no clonus GU: Neg CVAT.  PELVIC EXAM:   Dilation: 1.5 Effacement (%): 50 Cervical Position: Posterior Station: -2 Presentation: Vertex Exam by:: Armanie Martine CNM   FHT:  Baseline 135 , moderate variability, accelerations present, no decelerations Contractions:  Irregular     Labs: Results for orders placed or performed during the hospital encounter of 03/13/20 (from the past 24 hour(s))  Protein / creatinine ratio, urine     Status: Abnormal   Collection Time: 03/13/20 12:54 AM  Result Value Ref Range   Creatinine, Urine 40.44 mg/dL   Total Protein, Urine 10 mg/dL   Protein Creatinine Ratio 0.25 (H) 0.00 - 0.15 mg/mg[Cre]  CBC     Status: Abnormal   Collection Time: 03/13/20  1:16 AM  Result Value Ref Range   WBC 12.3 (H) 4.0 - 10.5 K/uL   RBC 4.21 3.87 - 5.11 MIL/uL   Hemoglobin 12.1 12.0 - 15.0 g/dL   HCT 36.0 36.0 - 46.0 %   MCV 85.5 80.0 - 100.0 fL   MCH 28.7 26.0 - 34.0 pg   MCHC 33.6 30.0 - 36.0 g/dL   RDW 13.4 11.5 - 15.5 %   Platelets 230 150 - 400 K/uL   nRBC 0.0 0.0 - 0.2 %   Comprehensive metabolic panel     Status: Abnormal   Collection Time: 03/13/20  1:16 AM  Result Value Ref Range   Sodium 135 135 - 145 mmol/L   Potassium 3.6 3.5 - 5.1 mmol/L   Chloride 106 98 - 111 mmol/L   CO2 17 (L) 22 - 32 mmol/L   Glucose, Bld 88 70 - 99 mg/dL   BUN 5 (L) 6 - 20 mg/dL   Creatinine, Ser 0.45 0.44 - 1.00 mg/dL   Calcium 8.9 8.9 - 10.3 mg/dL   Total Protein 6.2 (L) 6.5 - 8.1 g/dL   Albumin 2.8 (L) 3.5 - 5.0  g/dL   AST 18 15 - 41 U/L   ALT 10 0 - 44 U/L   Alkaline Phosphatase 158 (H) 38 - 126 U/L   Total Bilirubin 0.7 0.3 - 1.2 mg/dL   GFR, Estimated >60 >60 mL/min   Anion gap 12 5 - 15    O/Positive/-- (08/12 1404)  Imaging:  No results found.  MAU Course/MDM: I have ordered labs and reviewed results. Labs are normal, Pr/Cr Ratio high normal  NST reviewed, reassuring Consult Dr Berniece Andreas with presentation, exam findings and test results. Since she meets criteria for Gestational Hypertension (elevated 2-3 days apart) and is past 37wks, will induce Discussed IOL may well be a lengthy process Treatments in MAU included EFM.    Assessment: Single IUP at 17w5dGestational Hypertension without preeclampsia   Plan: Admit to Labor and Delivery  Routine orders  IOL.  MHansel FeinsteinCNM, MSN Certified Nurse-Midwife 03/13/2020 12:53 AM

## 2020-03-13 NOTE — Anesthesia Procedure Notes (Signed)
Epidural Patient location during procedure: OB Start time: 03/13/2020 1:13 PM End time: 03/13/2020 1:16 PM  Staffing Anesthesiologist: Brennan Bailey, MD Performed: anesthesiologist   Preanesthetic Checklist Completed: patient identified, IV checked, risks and benefits discussed, monitors and equipment checked, pre-op evaluation and timeout performed  Epidural Patient position: sitting Prep: DuraPrep and site prepped and draped Patient monitoring: continuous pulse ox, blood pressure and heart rate Approach: midline Location: L3-L4 Injection technique: LOR air  Needle:  Needle type: Tuohy  Needle gauge: 17 G Needle length: 9 cm Needle insertion depth: 6 cm Catheter type: closed end flexible Catheter size: 19 Gauge Catheter at skin depth: 11 cm Test dose: negative and Other (1% lidocaine)  Assessment Events: blood not aspirated, injection not painful, no injection resistance, no paresthesia and negative IV test  Additional Notes Patient identified. Risks, benefits, and alternatives discussed with patient including but not limited to bleeding, infection, nerve damage, paralysis, failed block, incomplete pain control, headache, blood pressure changes, nausea, vomiting, reactions to medication, itching, and postpartum back pain. Confirmed with bedside nurse the patient's most recent platelet count. Confirmed with patient that they are not currently taking any anticoagulation, have any bleeding history, or any family history of bleeding disorders. Patient expressed understanding and wished to proceed. All questions were answered. Sterile technique was used throughout the entire procedure. Please see nursing notes for vital signs.   Crisp LOR on first pass. Test dose was given through epidural catheter and negative prior to continuing to dose epidural or start infusion. Warning signs of high block given to the patient including shortness of breath, tingling/numbness in hands, complete  motor block, or any concerning symptoms with instructions to call for help. Patient was given instructions on fall risk and not to get out of bed. All questions and concerns addressed with instructions to call with any issues or inadequate analgesia.  Reason for block:procedure for pain

## 2020-03-13 NOTE — H&P (Signed)
HPI: Catherine Paul is a 25 y.o. G1P0 at 43w5dho presents to maternity admissions reporting elevated Blood Pressures at home and RUQ/Epigastric pain.  Had one day with elevated BPs in office 2 days ago. .Marland Kitchen 03/11/20 1036 03/11/20 1037  BP: (!) 135/91 131/86  Pulse: (!) 109     She reports good fetal movement, denies LOF, vaginal bleeding, vaginal itching/burning, urinary symptoms, h/a, dizziness, n/v, diarrhea, constipation or fever/chills.  She denies headache, visual changes.  Abdominal Pain This is a new problem. The current episode started today. The onset quality is gradual. The problem occurs intermittently. The problem has been unchanged. The pain is located in the epigastric region and RUQ. The quality of the pain is dull. The abdominal pain does not radiate. Pertinent negatives include no constipation, diarrhea, dysuria, fever, headaches, myalgias, nausea or vomiting. Nothing aggravates the pain. The pain is relieved by nothing.  Hypertension This is a new problem. The current episode started 1 to 4 weeks ago. The problem has been waxing and waning since onset. Pertinent negatives include no anxiety, blurred vision, chest pain, headaches, peripheral edema or shortness of breath. There are no associated agents to hypertension. Past treatments include nothing. There are no compliance problems.     RN Note: Pt reports upper abd pain today off/on. B/P at home 120's/90's. Denies contractions, bleeding or ROM. Reports good fetal movement.    Past Medical History: History reviewed. No pertinent past medical history.  Past obstetric history: OB History  Gravida Para Term Preterm AB Living  1            SAB IAB Ectopic Multiple Live Births               # Outcome Date GA Lbr Len/2nd Weight Sex Delivery Anes PTL Lv  1 Current             Past Surgical History: Past Surgical History:  Procedure Laterality Date  . NO PAST SURGERIES      Family History: Family History  Problem  Relation Age of Onset  . Diabetes Maternal Grandmother   . Hypertension Maternal Grandmother   . Hypertension Maternal Grandfather   . Stroke Maternal Grandfather   . Hypertension Paternal Grandmother     Social History: Social History   Tobacco Use  . Smoking status: Never Smoker  . Smokeless tobacco: Never Used  Vaping Use  . Vaping Use: Never used  Substance Use Topics  . Alcohol use: Not Currently  . Drug use: Never    Allergies: No Known Allergies  Meds:  Medications Prior to Admission  Medication Sig Dispense Refill Last Dose  . cyclobenzaprine (FLEXERIL) 5 MG tablet Take 1-2 tablets (5-10 mg total) by mouth 3 (three) times daily as needed for muscle spasms. 30 tablet 0 Past Month at Unknown time  . metroNIDAZOLE (FLAGYL) 500 MG tablet Take 1 tablet (500 mg total) by mouth 2 (two) times daily. 14 tablet 0 Past Month at Unknown time  . Prenatal Vit-Fe Phos-FA-Omega (VITAFOL GUMMIES) 3.33-0.333-34.8 MG CHEW Chew 3 tablets by mouth daily. 90 tablet 11 03/12/2020 at Unknown time  . Blood Pressure Monitor KIT 1 kit by Does not apply route once a week. 1 kit 0   . Misc. Devices MISC Dispense one maternity belt for patient 1 each 0   . terconazole (TERAZOL 7) 0.4 % vaginal cream Place 1 applicator vaginally at bedtime. (Patient not taking: No sig reported) 45 g 0     I have reviewed patient's  Past Medical Hx, Surgical Hx, Family Hx, Social Hx, medications and allergies.   ROS:  Review of Systems  Constitutional: Negative for fever.  Eyes: Negative for blurred vision.  Respiratory: Negative for shortness of breath.   Cardiovascular: Negative for chest pain.  Gastrointestinal: Positive for abdominal pain. Negative for constipation, diarrhea, nausea and vomiting.  Genitourinary: Negative for dysuria.  Musculoskeletal: Negative for myalgias.  Neurological: Negative for headaches.   Other systems negative  Physical Exam   Patient Vitals for the past 24 hrs:  BP Temp Temp  src Pulse Resp SpO2 Height Weight  03/13/20 0047 126/81 97.8 F (36.6 C) Oral 89 17 98 % 5' 3" (1.6 m) 84.6 kg   Vitals:   03/13/20 0100 03/13/20 0115 03/13/20 0130 03/13/20 0145  BP: 129/90 123/84 124/83 (!) 123/92  Pulse: (!) 103 (!) 102 100 (!) 108  Resp:      Temp:      TempSrc:      SpO2: 98% 99% 98% 98%  Weight:      Height:        Constitutional: Well-developed, well-nourished female in no acute distress.  Cardiovascular: normal rate and rhythm Respiratory: normal effort, clear to auscultation bilaterally GI: Abd soft, non-tender, gravid appropriate for gestational age.   No rebound or guarding. MS: Extremities nontender, no edema, normal ROM Neurologic: Alert and oriented x 4.   DTRs 2+ with no clonus GU: Neg CVAT.  PELVIC EXAM:   Dilation: 1.5 Effacement (%): 50 Cervical Position: Posterior Station: -2 Presentation: Vertex Exam by:: Dannetta Lekas CNM   FHT:  Baseline 135 , moderate variability, accelerations present, no decelerations Contractions:  Irregular     Results for orders placed or performed during the hospital encounter of 03/13/20 (from the past 24 hour(s))  Protein / creatinine ratio, urine     Status: Abnormal   Collection Time: 03/13/20 12:54 AM  Result Value Ref Range   Creatinine, Urine 40.44 mg/dL   Total Protein, Urine 10 mg/dL   Protein Creatinine Ratio 0.25 (H) 0.00 - 0.15 mg/mg[Cre]  CBC     Status: Abnormal   Collection Time: 03/13/20  1:16 AM  Result Value Ref Range   WBC 12.3 (H) 4.0 - 10.5 K/uL   RBC 4.21 3.87 - 5.11 MIL/uL   Hemoglobin 12.1 12.0 - 15.0 g/dL   HCT 36.0 36.0 - 46.0 %   MCV 85.5 80.0 - 100.0 fL   MCH 28.7 26.0 - 34.0 pg   MCHC 33.6 30.0 - 36.0 g/dL   RDW 13.4 11.5 - 15.5 %   Platelets 230 150 - 400 K/uL   nRBC 0.0 0.0 - 0.2 %  Comprehensive metabolic panel     Status: Abnormal   Collection Time: 03/13/20  1:16 AM  Result Value Ref Range   Sodium 135 135 - 145 mmol/L   Potassium 3.6 3.5 - 5.1 mmol/L   Chloride 106  98 - 111 mmol/L   CO2 17 (L) 22 - 32 mmol/L   Glucose, Bld 88 70 - 99 mg/dL   BUN 5 (L) 6 - 20 mg/dL   Creatinine, Ser 0.45 0.44 - 1.00 mg/dL   Calcium 8.9 8.9 - 10.3 mg/dL   Total Protein 6.2 (L) 6.5 - 8.1 g/dL   Albumin 2.8 (L) 3.5 - 5.0 g/dL   AST 18 15 - 41 U/L   ALT 10 0 - 44 U/L   Alkaline Phosphatase 158 (H) 38 - 126 U/L   Total Bilirubin 0.7 0.3 - 1.2 mg/dL  GFR, Estimated >60 >60 mL/min   Anion gap 12 5 - 15        Maternal Diabetes: No Genetic Screening: Normal Maternal Ultrasounds/Referrals: Normal Fetal Ultrasounds or other Referrals:  None Maternal Substance Abuse:  No Significant Maternal Medications:  None Significant Maternal Lab Results:  Group B Strep negative Other Comments:  New Gestational Hypertension  Prenatal labs: ABO, Rh: O/Positive/-- (08/12 1404) Antibody: Negative (08/12 1404) Rubella: 8.46 (08/12 1404) RPR: Non Reactive (12/14 0941)  HBsAg: Negative (08/12 1404)  HIV: Non Reactive (12/14 0941)  GBS: Negative/-- (01/31 0242)   Assessment/Plan: MAU Course/MDM: I have ordered labs and reviewed results. Labs are normal, Pr/Cr Ratio high normal  NST reviewed, reassuring Consult Dr Berniece Andreas with presentation, exam findings and test results. Since she meets criteria for Gestational Hypertension (elevated 2-3 days apart) and is past 37wks, will induce Discussed IOL may well be a lengthy process Treatments in MAU included EFM.    Assessment: Single IUP at 61w5dGestational Hypertension without preeclampsia   Plan: Admit to Labor and Delivery  Routine orders  IOL. Labor Team to follow  MHansel FeinsteinCNM, MSN Certified Nurse-Midwife 03/13/2020 12:53 AM   MHansel Feinstein2/11/2020, 2:28 AM

## 2020-03-13 NOTE — Discharge Instructions (Signed)

## 2020-03-14 NOTE — Progress Notes (Signed)
Post Partum Day 1 Subjective: no complaints, up ad lib, voiding and tolerating PO  Objective: Blood pressure 121/73, pulse 82, temperature 97.7 F (36.5 C), temperature source Oral, resp. rate 18, height 5\' 3"  (1.6 m), weight 84.6 kg, last menstrual period 06/27/2019, SpO2 99 %, unknown if currently breastfeeding. Vitals:   03/13/20 2030 03/13/20 2118 03/14/20 0026 03/14/20 0523  BP: 121/84 119/87 119/78 121/73  Pulse: 92 93 88 82  Resp: 18 18 18 18   Temp: 97.7 F (36.5 C) 98.1 F (36.7 C) 98 F (36.7 C) 97.7 F (36.5 C)  TempSrc: Axillary  Oral Oral  SpO2: 99%  98% 99%  Weight:      Height:        Physical Exam:  General: alert, cooperative and no distress Lochia: appropriate Uterine Fundus: firm Incision: n/a DVT Evaluation: No evidence of DVT seen on physical exam.  Recent Labs    03/13/20 1232 03/13/20 1851  HGB 11.9* 12.1  HCT 35.6* 36.7    Assessment/Plan: Plan for discharge tomorrow and Breastfeeding   LOS: 1 day   Hansel Feinstein 03/14/2020, 7:03 AM

## 2020-03-14 NOTE — Anesthesia Postprocedure Evaluation (Signed)
Anesthesia Post Note  Patient: Catherine Paul  Procedure(s) Performed: AN AD HOC LABOR EPIDURAL     Patient location during evaluation: Mother Baby Anesthesia Type: Epidural Level of consciousness: awake and alert Pain management: pain level controlled Respiratory status: spontaneous breathing Cardiovascular status: stable Postop Assessment: no headache, adequate PO intake, no backache, patient able to bend at knees, able to ambulate, epidural receding and no apparent nausea or vomiting Anesthetic complications: no   No complications documented.  Last Vitals:  Vitals:   03/14/20 0026 03/14/20 0523  BP: 119/78 121/73  Pulse: 88 82  Resp: 18 18  Temp: 36.7 C 36.5 C  SpO2: 98% 99%    Last Pain:  Vitals:   03/14/20 0523  TempSrc: Oral  PainSc: 0-No pain   Pain Goal:                   [name]

## 2020-03-15 MED ORDER — NORETHINDRONE 0.35 MG PO TABS: 1.0000 | ORAL_TABLET | Freq: Every day | ORAL | 6 refills | Status: DC

## 2020-03-15 MED ORDER — IBUPROFEN 600 MG PO TABS: 600.0000 mg | ORAL_TABLET | Freq: Three times a day (TID) | ORAL | 0 refills | Status: DC | PRN

## 2020-03-15 MED ORDER — COCONUT OIL OIL: 1.0000 "application " | TOPICAL_OIL | 0 refills | Status: DC | PRN

## 2020-03-15 NOTE — Lactation Note (Signed)
This note was copied from a baby's chart. Lactation Consultation Note  Patient Name: Catherine Paul ZOXWR'U Date: 03/15/2020 Reason for consult: Follow-up assessment;Primapara;Early term 37-38.6wks Age:24 hours Infant rooting on arrival.  LC assisted from moving mom from cradle hold to cross cradle hold.  Mom reports more comfort.  Rhythmic suckling and audible swallows.  Left mom and baby breastfeeding.  Urged to call lactation as needed. Maternal Data Has patient been taught Hand Expression?: Yes  Feeding Mother's Current Feeding Choice: Breast Milk  LATCH Score                    Lactation Tools Discussed/Used    Interventions Interventions: Breast feeding basics reviewed;Hand express;Expressed milk  Discharge Discharge Education: Warning signs for feeding baby;Outpatient recommendation  Consult Status Consult Status: Complete Date: 03/15/20 Follow-up type: Call as needed    [facility] 03/15/2020, 11:11 AM

## 2020-03-15 NOTE — Lactation Note (Signed)
This note was copied from a Catherine's chart. Lactation Consultation Note  Patient Name: Catherine Paul Date: 03/15/2020   Age:24 hours Catherine Paul being d/c today. ETI delivered via  Vaginal with vacuum assist.   Minimal weight loss.  Adequate voids and stools.  Mom has sore nipples.Mom reports she feels they have gotten better with breastfeeding and soreness is improving.   Left nipple with very red compression stripe.  Showed mom how to hand express and rub expressed mothers milk on nipples and air dry.  Praised breastfeeding.  Urged to call lactation as needed. Maternal Data    Feeding    LATCH Score Latch: Grasps breast easily, tongue down, lips flanged, rhythmical sucking.  Audible Swallowing: Spontaneous and intermittent  Type of Nipple: Everted at rest and after stimulation  Comfort (Breast/Nipple): Soft / non-tender  Hold (Positioning): No assistance needed to correctly position infant at breast.  LATCH Score: 10   Lactation Tools Discussed/Used    Interventions    Discharge    Consult [place] 03/15/2020, 10:10 AM

## 2020-03-15 NOTE — Lactation Note (Signed)
This note was copied from a baby's chart. Lactation Consultation Note  Patient Name: Catherine Paul IRSWN'I Date: 03/15/2020 Age:24 hours  Mom and baby sleeping upon visit. LC will come back to room at another time as possible.     [name] A [name] 03/15/2020, 12:22 AM

## 2020-03-17 LAB — SURGICAL PATHOLOGY

## 2020-03-18 ENCOUNTER — Encounter: Payer: Medicaid Other | Admitting: Nurse Practitioner

## 2020-03-20 ENCOUNTER — Ambulatory Visit (INDEPENDENT_AMBULATORY_CARE_PROVIDER_SITE_OTHER): Payer: Medicaid Other

## 2020-03-20 ENCOUNTER — Other Ambulatory Visit: Payer: Self-pay

## 2020-03-20 VITALS — BP 128/83 | HR 83 | Ht 63.0 in | Wt 170.4 lb

## 2020-03-20 DIAGNOSIS — R030 Elevated blood-pressure reading, without diagnosis of hypertension: Secondary | ICD-10-CM

## 2020-03-20 NOTE — Progress Notes (Signed)
Subjective:  Catherine Paul is a 25 y.o. female here for BP check.   Hypertension ROS: Denies any swelling in feet or ankles, HA, blurred vision, or floaters.   Objective:  BP: 128/83  Appearance alert, well appearing, and in no distress. General exam BP noted to be well controlled today in office.    Assessment:   Blood Pressure well controlled.   Plan:  Current treatment plan is effective, no change in therapy. Pt advised to continue monitoring BP at home. Precautions given for PP preeclampsia symptoms. Pt advised to call back if readings become elevated or she becomes symptomatic.

## 2020-03-25 ENCOUNTER — Encounter: Payer: Medicaid Other | Admitting: Advanced Practice Midwife

## 2020-03-29 ENCOUNTER — Inpatient Hospital Stay (HOSPITAL_COMMUNITY): Admit: 2020-03-29 | Payer: Self-pay

## 2020-04-15 ENCOUNTER — Ambulatory Visit: Payer: Medicaid Other | Admitting: Obstetrics and Gynecology

## 2020-11-03 IMAGING — US US MFM OB DETAIL+14 WK
1 series · 13 of 28 positions shown · non-contrast
Comparison: none

[Series 1: us mfm ob detail+14 wk · 13 of 100 slices shown]
[im 4/100]
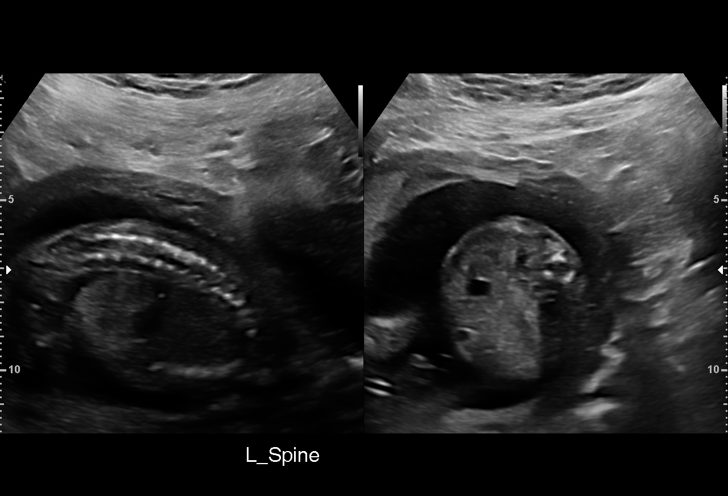
[im 12/100]
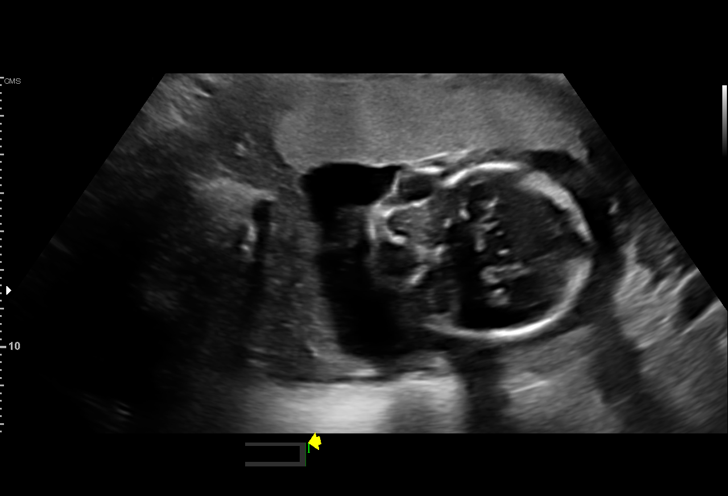
[im 19/100]
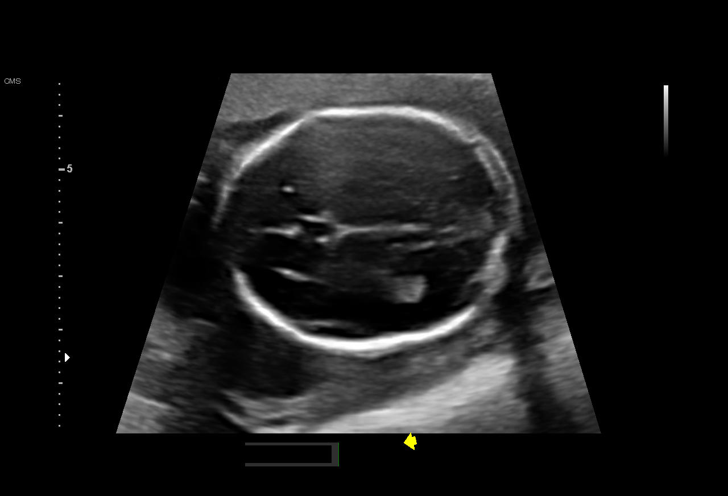
[im 26/100]
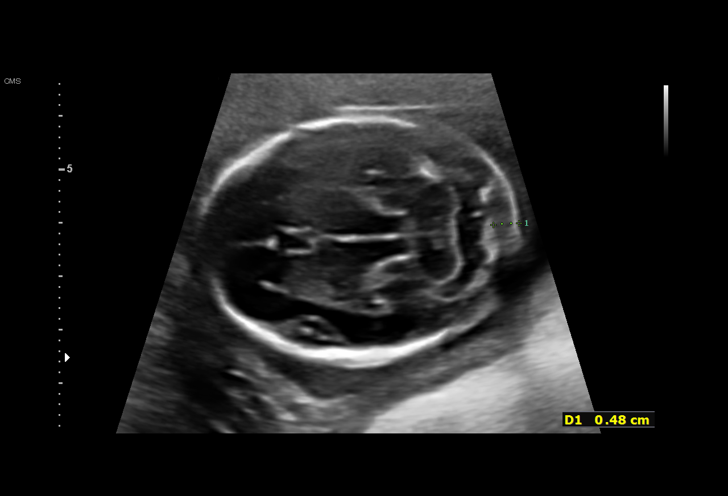
[im 34/100]
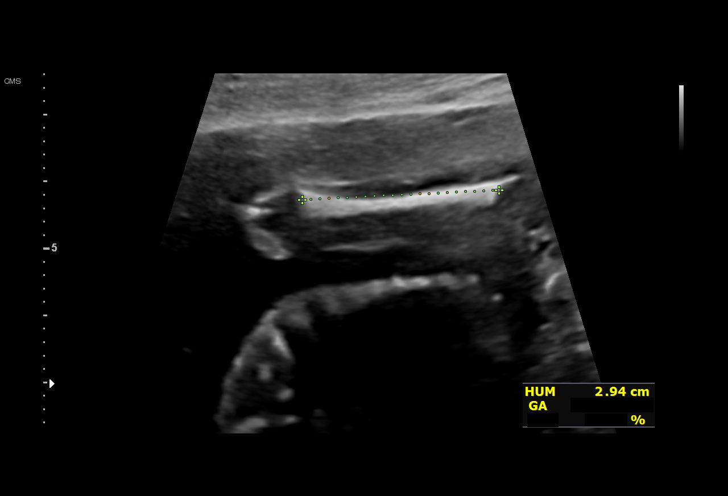
[im 41/100]
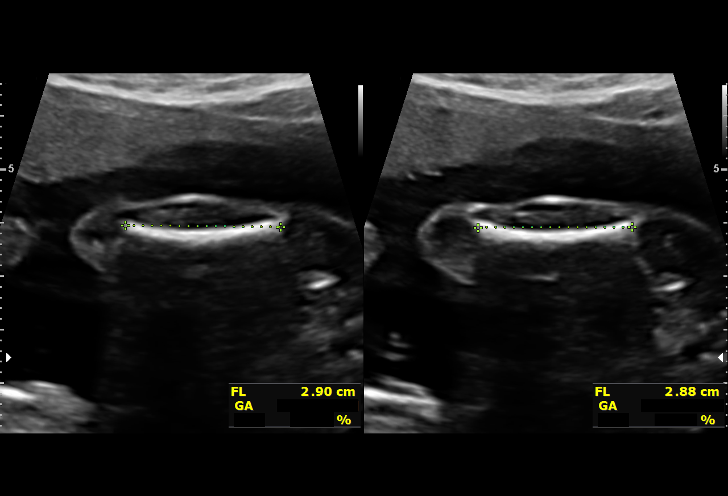
[im 52/100]
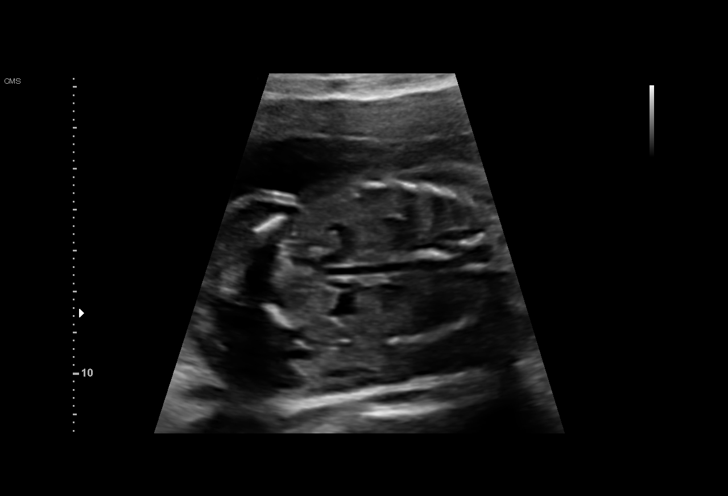
[im 59/100]
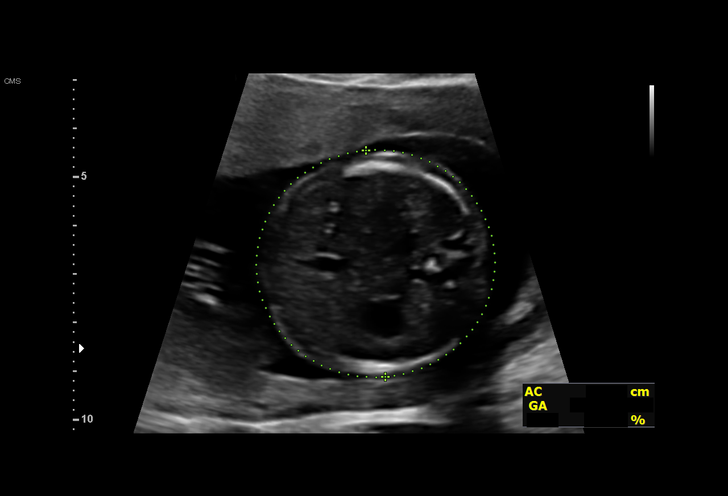
[im 67/100]
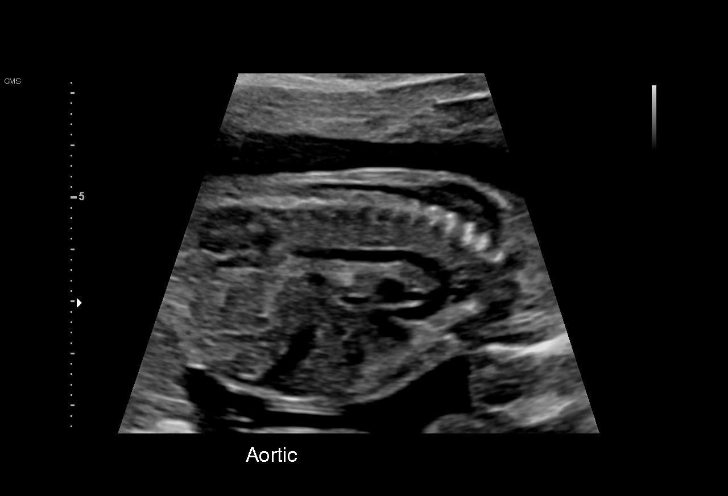
[im 74/100]
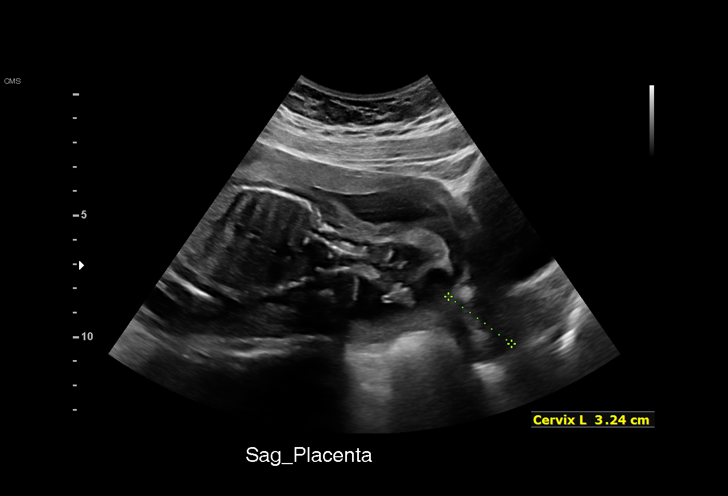
[im 81/100]
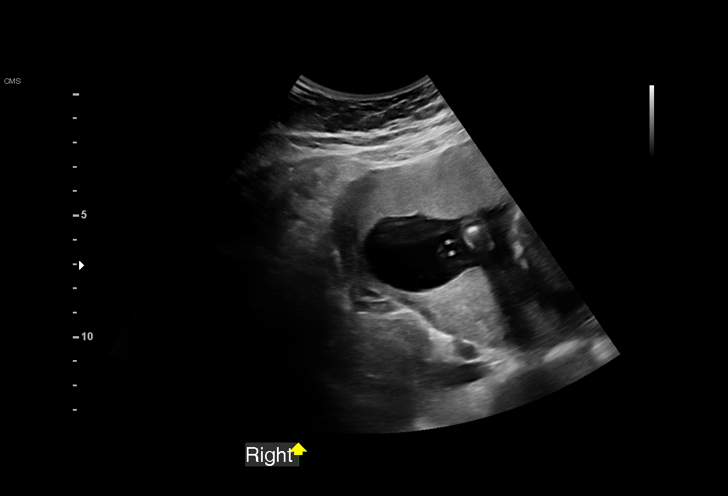
[im 89/100]
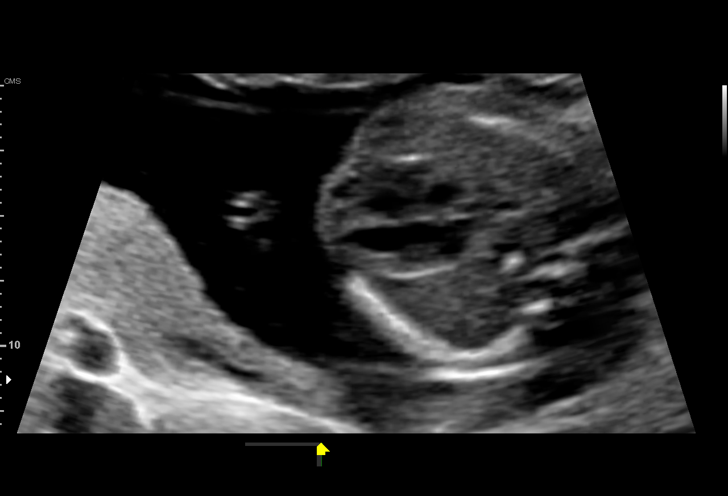
[im 96/100]
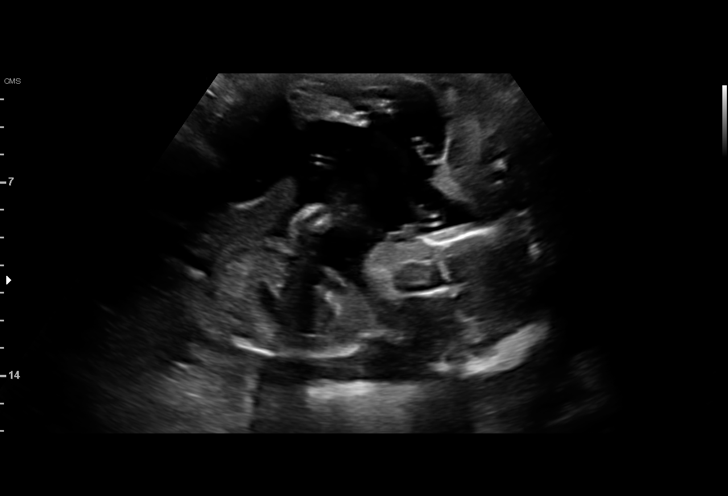

[13 of 28 positions shown; findings below may reference images not displayed]

[REDACTED]care

Indications

 Antenatal screening for malformations (low
 risk NIPS, neg AFP/Horizon 14
 Obesity complicating pregnancy, second
 trimester (pregravid BMI 30.3)
 19 weeks gestation of pregnancy
Fetal Evaluation

 Num Of Fetuses:         1
 Cardiac Activity:       Observed
 Presentation:           Cephalic
 Placenta:               Anterior
 P. Cord Insertion:      Visualized

 Amniotic Fluid
 AFI FV:      Within normal limits

                             Largest Pocket(cm)

Biometry

 BPD:      43.5  mm     G. Age:  19w 1d         45  %    CI:        71.06   %    70 - 86
                                                         FL/HC:      17.8   %    16.1 -
 HC:      164.4  mm     G. Age:  19w 1d         37  %    HC/AC:      1.10        1.09 -
 AC:      150.1  mm     G. Age:  20w 2d         77  %    FL/BPD:     67.1   %
 FL:       29.2  mm     G. Age:  19w 0d         32  %    FL/AC:      19.5   %    20 - 24
 HUM:      29.1  mm     G. Age:  19w 3d         56  %
 CER:      19.4  mm     G. Age:  18w 6d         32  %
 NFT:       4.3  mm
 LV:        5.8  mm
 CM:        4.3  mm

 Est. FW:     302  gm    0 lb 11 oz      65  %
OB History

 Gravidity:    1         Term:   0        Prem:   0        SAB:   0
 TOP:          0       Ectopic:  0        Living: 0
Gestational Age

 LMP:           18w 5d        Date:  06/27/19                 EDD:   04/02/20
 U/S Today:     19w 3d                                        EDD:   03/28/20
 Best:          19w 2d     Det. By:  U/S C R L  (09/05/19)    EDD:   03/29/20
Anatomy

 Cranium:               Appears normal         Aortic Arch:            Appears normal
 Cavum:                 Appears normal         Ductal Arch:            Appears normal
 Ventricles:            Appears normal         Diaphragm:              Appears normal
 Choroid Plexus:        Appears normal         Stomach:                Appears normal, left
                                                                       sided
 Cerebellum:            Appears normal         Abdomen:                Appears normal
 Posterior Fossa:       Appears normal         Abdominal Wall:         Appears nml (cord
                                                                       insert, abd wall)
 Nuchal Fold:           Appears normal         Cord Vessels:           Appears normal (3
                                                                       vessel cord)
 Face:                  Appears normal         Kidneys:                Appear normal
                        (orbits and profile)
 Lips:                  Appears normal         Bladder:                Appears normal
 Thoracic:              Appears normal         Spine:                  Appears normal
 Heart:                 Appears normal         Upper Extremities:      Appears normal
                        (4CH, axis, and
                        situs)
 RVOT:                  Appears normal         Lower Extremities:      Appears normal
 LVOT:                  Appears normal

 Other:  Heels/feet and RIGHT open hands/5th digits visualized. Nasal bone
         visualized.
Cervix Uterus Adnexa

 Cervix
 Length:           3.38  cm.
 Normal appearance by transabdominal scan.

 Uterus
 No abnormality visualized.

 Right Ovary
 Not visualized.

 Left Ovary
 Not visualized.

 Cul De Sac
 No free fluid seen.
 Adnexa
 No abnormality visualized.
Impression

 G1 P0. Patient is here for fetal anatomy scan.

 On cell-free fetal DNA screening, the risks of fetal
 aneuploidies are not increased .MSAFP screening showed
 low risk for open-neural tube defects .
 She reports no chronic medical conditions. Recently, she had
 [id number] infection.

 We performed fetal anatomy scan. No makers of
 aneuploidies or fetal structural defects are seen. Fetal
 biometry is consistent with her previously-established dates.
 Amniotic fluid is normal and good fetal activity is seen.
 Patient understands the limitations of ultrasound in detecting
 fetal anomalies.
Recommendations

 Follow-up scans as clinically indicated.
                 M Silva, Darbary

## 2021-07-30 DIAGNOSIS — R2232 Localized swelling, mass and lump, left upper limb: Secondary | ICD-10-CM

## 2021-07-30 HISTORY — DX: Localized swelling, mass and lump, left upper limb: R22.32

## 2021-08-21 ENCOUNTER — Encounter: Payer: Self-pay | Admitting: Plastic Surgery

## 2021-08-21 ENCOUNTER — Ambulatory Visit: Payer: BC Managed Care – PPO | Admitting: Plastic Surgery

## 2021-08-21 DIAGNOSIS — R2232 Localized swelling, mass and lump, left upper limb: Secondary | ICD-10-CM | POA: Insufficient documentation

## 2021-08-21 NOTE — Progress Notes (Signed)
     Patient ID: Catherine Paul, female    DOB: 1995/12/07, 26 y.o.   MRN: [id number]   Chief Complaint  Patient presents with   Consult   Skin Problem    The patient is a 26 yrs old female here for evaluation of her left axilla.  She is 5 feet 4 inches tall and weighs 186 pounds.  She noticed swelling in the left arm over the past 5 to 6 years.  It seems to be getting worse.  It is tender.  She had an ultrasound in 2017 which showed no areas of concern.  It does not drain.  She does not have it on the right axilla only on the left.  She is otherwise in good health and no other concerning skin lesions have been noted it is approximately 5 x 6 cm in size and is soft.    Review of Systems  Constitutional: Negative.   Eyes: Negative.   Respiratory: Negative.  Negative for chest tightness.   Cardiovascular: Negative.   Gastrointestinal: Negative.   Endocrine: Negative.   Genitourinary: Negative.   Musculoskeletal: Negative.   Skin: Negative.   Hematological: Negative.     History reviewed. No pertinent past medical history.  Past Surgical History:  Procedure Laterality Date   NO PAST SURGERIES        Current Outpatient Medications:    norethindrone (ORTHO MICRONOR) 0.35 MG tablet, Take 1 tablet (0.35 mg total) by mouth daily. (Patient not taking: Reported on 03/20/2020), Disp: 56 tablet, Rfl: 6   Objective:   Vitals:   08/21/21 1508  BP: 127/84  Pulse: 86  SpO2: 98%    Physical Exam Vitals reviewed.  Constitutional:      Appearance: Normal appearance.  Cardiovascular:     Rate and Rhythm: Normal rate.     Pulses: Normal pulses.  Pulmonary:     Effort: Pulmonary effort is normal.  Abdominal:     General: There is no distension.     Palpations: Abdomen is soft.     Tenderness: There is no abdominal tenderness.  Musculoskeletal:        General: No swelling.  Skin:    General: Skin is warm.     Capillary Refill: Capillary refill takes less than 2 seconds.      Coloration: Skin is not jaundiced.  Neurological:     Mental Status: She is alert and oriented to person, place, and time.  Psychiatric:        Mood and Affect: Mood normal.        Behavior: Behavior normal.        Thought Content: Thought content normal.        Judgment: Judgment normal.     Assessment & Plan:  Mass of axilla, left  Recommend excision of left axilla with biopsy for diagnosis.  We talked about the scar and the patient is okay with it.  Pictures were obtained of the patient and placed in the chart with the patient's or guardian's permission.   [place], DO

## 2021-09-10 NOTE — Progress Notes (Signed)
Patient ID: Catherine Paul, female    DOB: 1995/08/10, 26 y.o.   MRN: [id number]  No chief complaint on file.   No diagnosis found.   History of Present Illness: Catherine Paul is a 26 y.o.  female  with a history of left axillary mass.  She presents for preoperative evaluation for upcoming procedure, excision of left axillary mass, scheduled for 09/24/21 with Dr. Marla Roe.  Patient states she has never had anesthesia before.  Patient denies any personal history of breast cancer.  She states that her aunt has breast cancer.  Patient denies any history of cardiac disease.  She states that she is not taking any blood thinners.  Patient denies taking any birth control or hormone replacement.  She denies any history of miscarriages.  She denies any personal or family history of blood clots or clotting diseases.  She denies any recent traumas, infections, pregnancies, strokes or heart attacks.  She denies any changes in her interim health.  Patient denies any history of inflammatory bowel disease, lung disease or cancer.  Summary of Previous Visit: Patient saw Dr. Marla Roe on 08/21/21 in the clinic for consult. Patient reported she noticed swelling in her left arm over the past 5-6 years. Patient had an [place] in 2017 which showed no areas of concern. The area of swelling is approximately 5 x 6 cm and is soft. Excision of the left axilla with biopsy was recommended for the patient.   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  I discussed with the patient that if she needs a work note or any paperwork filled out she can let us know.  PMH Significant for: Left axillary mass   Past Medical History: Allergies: No Known Allergies  Current Medications:  Current Outpatient Medications:    norethindrone (ORTHO MICRONOR) 0.35 MG tablet, Take 1 tablet (0.35 mg total) by mouth daily. (Patient not taking: Reported on 03/20/2020), Disp: 56 tablet, Rfl:  6  Past Medical Problems: No past medical history on file.  Past Surgical History: Past Surgical History:  Procedure Laterality Date   NO PAST SURGERIES      Social History: Social History   Socioeconomic History   Marital status: Married    Spouse name: Not on file   Number of children: Not on file   Years of education: Not on file   Highest education level: Not on file  Occupational History   Not on file  Tobacco Use   Smoking status: Never   Smokeless tobacco: Never  Vaping Use   Vaping Use: Never used  Substance and Sexual Activity   Alcohol use: Not Currently   Drug use: Never   Sexual activity: Yes    Partners: Male    Birth control/protection: None  Other Topics Concern   Not on file  Social History Narrative   Not on file   Social Determinants of Health   Financial Resource Strain: Not on file  Food Insecurity: Not on file  Transportation Needs: Not on file  Physical Activity: Not on file  Stress: Not on file  Social Connections: Not on file  Intimate Partner Violence: Not on file    Family History: Family History  Problem Relation Age of Onset   Diabetes Maternal Grandmother    Hypertension Maternal Grandmother    Hypertension Maternal Grandfather    Stroke Maternal Grandfather    Hypertension Paternal Grandmother     Review of Systems: Denies changes  in her interim health  Physical Exam: Vital Signs There were no vitals taken for this visit.  Physical Exam  Constitutional:      General: Not in acute distress.    Appearance: Normal appearance. Not ill-appearing.  HENT:     Head: Normocephalic and atraumatic.  Neck:     Musculoskeletal: Normal range of motion.  Cardiovascular:     Rate and Rhythm: Normal rate Pulmonary:     Effort: Pulmonary effort is normal. No respiratory distress.  Musculoskeletal: Normal range of motion.  Lower extremities: No varicose veins, nonswollen Skin:    General: Skin is warm and dry.     Findings:  No erythema or rash.  Neurological:     Mental Status: Alert and oriented to person, place, and time. Mental status is at baseline.  Psychiatric:        Mood and Affect: Mood normal.        Behavior: Behavior normal.    Assessment/Plan: The patient is scheduled for excision of left axillary mass with Dr. Marla Roe.  Risks, benefits, and alternatives of procedure discussed, questions answered and consent obtained.    Smoking Status: Non-smoker; Counseling Given?  N/A  Caprini Score: 3; Risk Factors include: BMI greater than 25, and length of planned surgery. Recommendation for mechanical prophylaxis. Encourage early ambulation.   Pictures obtained: 08/21/21  Instructed patient to hold her multivitamins and to not take ibuprofen 1 week prior to surgery.  Post-op Rx sent to pharmacy: Oxycodone, Zofran, Keflex  Patient was provided with the General Surgical Risk consent document and Pain Medication Agreement prior to their appointment.  They had adequate time to read through the risk consent documents and Pain Medication Agreement. We also discussed them in person together during this preop appointment. All of their questions were answered to their satisfaction.  Recommended calling if they have any further questions.  Risk consent form and Pain Medication Agreement to be scanned into patient's chart.  The consent was obtained with risks and complications reviewed which included bleeding, pain, scar, infection and the risk of anesthesia.  The patients questions were answered to the patients expressed satisfaction.    Electronically signed by: Clance Boll, PA-C 09/10/2021 8:45 AM

## 2021-09-10 NOTE — H&P (View-Only) (Signed)
Patient ID: Catherine Paul, female    DOB: 1995-11-18, 26 y.o.   MRN: [id number]  No chief complaint on file.   No diagnosis found.   History of Present Illness: Catherine Paul is a 26 y.o.  female  with a history of left axillary mass.  She presents for preoperative evaluation for upcoming procedure, excision of left axillary mass, scheduled for 09/24/21 with Dr. Marla Roe.  Patient states she has never had anesthesia before.  Patient denies any personal history of breast cancer.  She states that her aunt has breast cancer.  Patient denies any history of cardiac disease.  She states that she is not taking any blood thinners.  Patient denies taking any birth control or hormone replacement.  She denies any history of miscarriages.  She denies any personal or family history of blood clots or clotting diseases.  She denies any recent traumas, infections, pregnancies, strokes or heart attacks.  She denies any changes in her interim health.  Patient denies any history of inflammatory bowel disease, lung disease or cancer.  Summary of Previous Visit: Patient saw Dr. Marla Roe on 08/21/21 in the clinic for consult. Patient reported she noticed swelling in her left arm over the past 5-6 years. Patient had an [place] in 2017 which showed no areas of concern. The area of swelling is approximately 5 x 6 cm and is soft. Excision of the left axilla with biopsy was recommended for the patient.   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  I discussed with the patient that if she needs a work note or any paperwork filled out she can let us know.  PMH Significant for: Left axillary mass   Past Medical History: Allergies: No Known Allergies  Current Medications:  Current Outpatient Medications:    norethindrone (ORTHO MICRONOR) 0.35 MG tablet, Take 1 tablet (0.35 mg total) by mouth daily. (Patient not taking: Reported on 03/20/2020), Disp: 56 tablet, Rfl:  6  Past Medical Problems: No past medical history on file.  Past Surgical History: Past Surgical History:  Procedure Laterality Date   NO PAST SURGERIES      Social History: Social History   Socioeconomic History   Marital status: Married    Spouse name: Not on file   Number of children: Not on file   Years of education: Not on file   Highest education level: Not on file  Occupational History   Not on file  Tobacco Use   Smoking status: Never   Smokeless tobacco: Never  Vaping Use   Vaping Use: Never used  Substance and Sexual Activity   Alcohol use: Not Currently   Drug use: Never   Sexual activity: Yes    Partners: Male    Birth control/protection: None  Other Topics Concern   Not on file  Social History Narrative   Not on file   Social Determinants of Health   Financial Resource Strain: Not on file  Food Insecurity: Not on file  Transportation Needs: Not on file  Physical Activity: Not on file  Stress: Not on file  Social Connections: Not on file  Intimate Partner Violence: Not on file    Family History: Family History  Problem Relation Age of Onset   Diabetes Maternal Grandmother    Hypertension Maternal Grandmother    Hypertension Maternal Grandfather    Stroke Maternal Grandfather    Hypertension Paternal Grandmother     Review of Systems: Denies changes  in her interim health  Physical Exam: Vital Signs There were no vitals taken for this visit.  Physical Exam  Constitutional:      General: Not in acute distress.    Appearance: Normal appearance. Not ill-appearing.  HENT:     Head: Normocephalic and atraumatic.  Neck:     Musculoskeletal: Normal range of motion.  Cardiovascular:     Rate and Rhythm: Normal rate Pulmonary:     Effort: Pulmonary effort is normal. No respiratory distress.  Musculoskeletal: Normal range of motion.  Lower extremities: No varicose veins, nonswollen Skin:    General: Skin is warm and dry.     Findings:  No erythema or rash.  Neurological:     Mental Status: Alert and oriented to person, place, and time. Mental status is at baseline.  Psychiatric:        Mood and Affect: Mood normal.        Behavior: Behavior normal.    Assessment/Plan: The patient is scheduled for excision of left axillary mass with Dr. Marla Roe.  Risks, benefits, and alternatives of procedure discussed, questions answered and consent obtained.    Smoking Status: Non-smoker; Counseling Given?  N/A  Caprini Score: 3; Risk Factors include: BMI greater than 25, and length of planned surgery. Recommendation for mechanical prophylaxis. Encourage early ambulation.   Pictures obtained: 08/21/21  Instructed patient to hold her multivitamins and to not take ibuprofen 1 week prior to surgery.  Post-op Rx sent to pharmacy: Oxycodone, Zofran, Keflex  Patient was provided with the General Surgical Risk consent document and Pain Medication Agreement prior to their appointment.  They had adequate time to read through the risk consent documents and Pain Medication Agreement. We also discussed them in person together during this preop appointment. All of their questions were answered to their satisfaction.  Recommended calling if they have any further questions.  Risk consent form and Pain Medication Agreement to be scanned into patient's chart.  The consent was obtained with risks and complications reviewed which included bleeding, pain, scar, infection and the risk of anesthesia.  The patients questions were answered to the patients expressed satisfaction.    Electronically signed by: Clance Boll, PA-C 09/10/2021 8:45 AM

## 2021-09-11 ENCOUNTER — Encounter: Payer: Self-pay | Admitting: Student

## 2021-09-11 ENCOUNTER — Ambulatory Visit (INDEPENDENT_AMBULATORY_CARE_PROVIDER_SITE_OTHER): Payer: BC Managed Care – PPO | Admitting: Student

## 2021-09-11 VITALS — BP 123/84 | HR 88 | Ht 64.0 in | Wt 186.4 lb

## 2021-09-11 DIAGNOSIS — R2232 Localized swelling, mass and lump, left upper limb: Secondary | ICD-10-CM

## 2021-09-11 MED ORDER — CEPHALEXIN 500 MG PO CAPS: 500.0000 mg | ORAL_CAPSULE | Freq: Four times a day (QID) | ORAL | 0 refills | Status: AC

## 2021-09-11 MED ORDER — OXYCODONE HCL 5 MG PO TABS: 5.0000 mg | ORAL_TABLET | Freq: Three times a day (TID) | ORAL | 0 refills | Status: DC | PRN

## 2021-09-11 MED ORDER — ONDANSETRON HCL 4 MG PO TABS: 4.0000 mg | ORAL_TABLET | Freq: Three times a day (TID) | ORAL | 0 refills | Status: DC | PRN

## 2021-09-18 ENCOUNTER — Encounter (HOSPITAL_BASED_OUTPATIENT_CLINIC_OR_DEPARTMENT_OTHER): Payer: Self-pay | Admitting: Plastic Surgery

## 2021-09-24 ENCOUNTER — Encounter (HOSPITAL_BASED_OUTPATIENT_CLINIC_OR_DEPARTMENT_OTHER): Payer: Self-pay | Admitting: Plastic Surgery

## 2021-09-24 ENCOUNTER — Other Ambulatory Visit: Payer: Self-pay

## 2021-09-24 ENCOUNTER — Ambulatory Visit (HOSPITAL_BASED_OUTPATIENT_CLINIC_OR_DEPARTMENT_OTHER): Payer: BC Managed Care – PPO | Admitting: Certified Registered"

## 2021-09-24 ENCOUNTER — Ambulatory Visit (HOSPITAL_BASED_OUTPATIENT_CLINIC_OR_DEPARTMENT_OTHER)
Admission: RE | Admit: 2021-09-24 | Discharge: 2021-09-24 | Disposition: A | Discharge status: Home / Self Care | Payer: BC Managed Care – PPO | Source: Ambulatory Visit | Attending: Plastic Surgery | Admitting: Plastic Surgery

## 2021-09-24 ENCOUNTER — Encounter (HOSPITAL_BASED_OUTPATIENT_CLINIC_OR_DEPARTMENT_OTHER)
Admission: RE | Disposition: A | Discharge status: Home / Self Care | Payer: Self-pay | Source: Ambulatory Visit | Attending: Plastic Surgery

## 2021-09-24 DIAGNOSIS — D171 Benign lipomatous neoplasm of skin and subcutaneous tissue of trunk: Secondary | ICD-10-CM | POA: Diagnosis present

## 2021-09-24 DIAGNOSIS — Z01818 Encounter for other preprocedural examination: Secondary | ICD-10-CM

## 2021-09-24 DIAGNOSIS — R222 Localized swelling, mass and lump, trunk: Secondary | ICD-10-CM | POA: Diagnosis not present

## 2021-09-24 DIAGNOSIS — Z803 Family history of malignant neoplasm of breast: Secondary | ICD-10-CM | POA: Insufficient documentation

## 2021-09-24 HISTORY — PX: MASS EXCISION: SHX2000

## 2021-09-24 LAB — POCT PREGNANCY, URINE: Preg Test, Ur: NEGATIVE

## 2021-09-24 SURGERY — EXCISION MASS
Anesthesia: General | Site: Axilla | Laterality: Left

## 2021-09-24 MED ORDER — SODIUM CHLORIDE 0.9% FLUSH: 3.0000 mL | INTRAVENOUS | Status: DC | PRN

## 2021-09-24 MED ORDER — CEFAZOLIN SODIUM-DEXTROSE 2-4 GM/100ML-% IV SOLN
2.0000 g | INTRAVENOUS | Status: AC
  Administered 2021-09-24: 2 g via INTRAVENOUS

## 2021-09-24 MED ORDER — ONDANSETRON HCL 4 MG/2ML IJ SOLN
INTRAMUSCULAR | Status: DC | PRN
  Administered 2021-09-24: 4 mg via INTRAVENOUS

## 2021-09-24 MED ORDER — CEFAZOLIN SODIUM-DEXTROSE 2-4 GM/100ML-% IV SOLN
INTRAVENOUS | Status: AC
  Filled 2021-09-24: qty 100

## 2021-09-24 MED ORDER — LIDOCAINE 2% (20 MG/ML) 5 ML SYRINGE
INTRAMUSCULAR | Status: AC
  Filled 2021-09-24: qty 5

## 2021-09-24 MED ORDER — LACTATED RINGERS IV SOLN: INTRAVENOUS | Status: DC

## 2021-09-24 MED ORDER — PROPOFOL 10 MG/ML IV BOLUS
INTRAVENOUS | Status: AC
  Filled 2021-09-24: qty 20

## 2021-09-24 MED ORDER — ONDANSETRON HCL 4 MG/2ML IJ SOLN
INTRAMUSCULAR | Status: AC
  Filled 2021-09-24: qty 2

## 2021-09-24 MED ORDER — FENTANYL CITRATE (PF) 100 MCG/2ML IJ SOLN: 25.0000 ug | INTRAMUSCULAR | Status: DC | PRN

## 2021-09-24 MED ORDER — FENTANYL CITRATE (PF) 100 MCG/2ML IJ SOLN
INTRAMUSCULAR | Status: DC | PRN
Start: 2021-09-24 — End: 2021-09-24
  Administered 2021-09-24: 25 ug via INTRAVENOUS
  Administered 2021-09-24: 50 ug via INTRAVENOUS
  Administered 2021-09-24: 25 ug via INTRAVENOUS

## 2021-09-24 MED ORDER — MIDAZOLAM HCL 2 MG/2ML IJ SOLN
INTRAMUSCULAR | Status: AC
  Filled 2021-09-24: qty 2

## 2021-09-24 MED ORDER — DEXAMETHASONE SODIUM PHOSPHATE 4 MG/ML IJ SOLN
INTRAMUSCULAR | Status: DC | PRN
  Administered 2021-09-24: 8 mg via INTRAVENOUS

## 2021-09-24 MED ORDER — EPINEPHRINE PF 1 MG/ML IJ SOLN
INTRAMUSCULAR | Status: AC
  Filled 2021-09-24: qty 1

## 2021-09-24 MED ORDER — CHLORHEXIDINE GLUCONATE CLOTH 2 % EX PADS: 6.0000 | MEDICATED_PAD | Freq: Once | CUTANEOUS | Status: DC

## 2021-09-24 MED ORDER — LIDOCAINE HCL (CARDIAC) PF 100 MG/5ML IV SOSY
PREFILLED_SYRINGE | INTRAVENOUS | Status: DC | PRN
  Administered 2021-09-24: 60 mg via INTRAVENOUS

## 2021-09-24 MED ORDER — ACETAMINOPHEN 325 MG PO TABS: 650.0000 mg | ORAL_TABLET | ORAL | Status: DC | PRN

## 2021-09-24 MED ORDER — SODIUM CHLORIDE 0.9% FLUSH: 3.0000 mL | Freq: Two times a day (BID) | INTRAVENOUS | Status: DC

## 2021-09-24 MED ORDER — MIDAZOLAM HCL 5 MG/5ML IJ SOLN
INTRAMUSCULAR | Status: DC | PRN
  Administered 2021-09-24: 2 mg via INTRAVENOUS

## 2021-09-24 MED ORDER — PROPOFOL 10 MG/ML IV BOLUS
INTRAVENOUS | Status: DC | PRN
  Administered 2021-09-24: 170 mg via INTRAVENOUS

## 2021-09-24 MED ORDER — ACETAMINOPHEN 325 MG RE SUPP: 650.0000 mg | RECTAL | Status: DC | PRN

## 2021-09-24 MED ORDER — DEXAMETHASONE SODIUM PHOSPHATE 10 MG/ML IJ SOLN
INTRAMUSCULAR | Status: AC
  Filled 2021-09-24: qty 1

## 2021-09-24 MED ORDER — FENTANYL CITRATE (PF) 100 MCG/2ML IJ SOLN
INTRAMUSCULAR | Status: AC
  Filled 2021-09-24: qty 2

## 2021-09-24 MED ORDER — OXYCODONE HCL 5 MG PO TABS
ORAL_TABLET | ORAL | Status: AC
  Filled 2021-09-24: qty 1

## 2021-09-24 MED ORDER — OXYCODONE HCL 5 MG PO TABS
5.0000 mg | ORAL_TABLET | ORAL | Status: DC | PRN
  Administered 2021-09-24: 5 mg via ORAL

## 2021-09-24 MED ORDER — ACETAMINOPHEN 10 MG/ML IV SOLN: 1000.0000 mg | Freq: Once | INTRAVENOUS | Status: DC | PRN

## 2021-09-24 MED ORDER — SODIUM CHLORIDE 0.9 % IV SOLN: 250.0000 mL | INTRAVENOUS | Status: DC | PRN

## 2021-09-24 MED ORDER — LIDOCAINE-EPINEPHRINE 1 %-1:100000 IJ SOLN
INTRAMUSCULAR | Status: DC | PRN
  Administered 2021-09-24: 9 mL via INTRAMUSCULAR

## 2021-09-24 MED ORDER — LIDOCAINE HCL (PF) 1 % IJ SOLN
INTRAMUSCULAR | Status: AC
  Filled 2021-09-24: qty 30

## 2021-09-24 SURGICAL SUPPLY — 71 items
ADH SKN CLS APL DERMABOND .7 (GAUZE/BANDAGES/DRESSINGS)
APL SKNCLS STERI-STRIP NONHPOA (GAUZE/BANDAGES/DRESSINGS)
BAND INSRT 18 STRL LF DISP RB (MISCELLANEOUS)
BAND RUBBER #18 3X1/16 STRL (MISCELLANEOUS) IMPLANT
BENZOIN TINCTURE PRP APPL 2/3 (GAUZE/BANDAGES/DRESSINGS) IMPLANT
BLADE CLIPPER SURG (BLADE) IMPLANT
BLADE SURG 15 STRL LF DISP TIS (BLADE) ×1 IMPLANT
BLADE SURG 15 STRL SS (BLADE) ×1
BNDG CMPR 75X21 PLY HI ABS (MISCELLANEOUS)
BNDG ELASTIC 2X5.8 VLCR STR LF (GAUZE/BANDAGES/DRESSINGS) IMPLANT
CANISTER SUCT 1200ML W/VALVE (MISCELLANEOUS) IMPLANT
CLEANER CAUTERY TIP 5X5 PAD (MISCELLANEOUS) IMPLANT
CORD BIPOLAR FORCEPS 12FT (ELECTRODE) IMPLANT
COVER BACK TABLE 60X90IN (DRAPES) ×1 IMPLANT
COVER MAYO STAND STRL (DRAPES) ×1 IMPLANT
DERMABOND ADVANCED (GAUZE/BANDAGES/DRESSINGS)
DERMABOND ADVANCED .7 DNX12 (GAUZE/BANDAGES/DRESSINGS) IMPLANT
DRAPE LAPAROTOMY 100X72 PEDS (DRAPES) IMPLANT
DRAPE U-SHAPE 76X120 STRL (DRAPES) ×1 IMPLANT
DRSG TEGADERM 2-3/8X2-3/4 SM (GAUZE/BANDAGES/DRESSINGS) IMPLANT
DRSG TEGADERM 4X4.75 (GAUZE/BANDAGES/DRESSINGS) IMPLANT
ELECT COATED BLADE 2.86 ST (ELECTRODE) IMPLANT
ELECT NDL BLADE 2-5/6 (NEEDLE) IMPLANT
ELECT NEEDLE BLADE 2-5/6 (NEEDLE) IMPLANT
ELECT REM PT RETURN 9FT ADLT (ELECTROSURGICAL) ×1
ELECT REM PT RETURN 9FT PED (ELECTROSURGICAL)
ELECTRODE REM PT RETRN 9FT PED (ELECTROSURGICAL) IMPLANT
ELECTRODE REM PT RTRN 9FT ADLT (ELECTROSURGICAL) IMPLANT
GAUZE SPONGE 4X4 12PLY STRL LF (GAUZE/BANDAGES/DRESSINGS) IMPLANT
GAUZE STRETCH 2X75IN STRL (MISCELLANEOUS) IMPLANT
GLOVE BIO SURGEON STRL SZ 6.5 (GLOVE) ×2 IMPLANT
GLOVE BIOGEL M STRL SZ7.5 (GLOVE) ×1 IMPLANT
GOWN STRL REUS W/ TWL LRG LVL3 (GOWN DISPOSABLE) ×2 IMPLANT
GOWN STRL REUS W/ TWL XL LVL3 (GOWN DISPOSABLE) ×1 IMPLANT
GOWN STRL REUS W/TWL LRG LVL3 (GOWN DISPOSABLE) ×2
GOWN STRL REUS W/TWL XL LVL3 (GOWN DISPOSABLE) ×1
NDL HYPO 27GX1-1/4 (NEEDLE) ×1 IMPLANT
NDL HYPO 30GX1 BEV (NEEDLE) IMPLANT
NEEDLE HYPO 27GX1-1/4 (NEEDLE) ×1 IMPLANT
NEEDLE HYPO 30GX1 BEV (NEEDLE) IMPLANT
NS IRRIG 1000ML POUR BTL (IV SOLUTION) IMPLANT
PACK BASIN DAY SURGERY FS (CUSTOM PROCEDURE TRAY) ×1 IMPLANT
PAD CLEANER CAUTERY TIP 5X5 (MISCELLANEOUS)
PENCIL SMOKE EVACUATOR (MISCELLANEOUS) IMPLANT
SHEET MEDIUM DRAPE 40X70 STRL (DRAPES) IMPLANT
SLEEVE SCD COMPRESS KNEE MED (STOCKING) IMPLANT
SPIKE FLUID TRANSFER (MISCELLANEOUS) IMPLANT
SPONGE GAUZE 2X2 8PLY STRL LF (GAUZE/BANDAGES/DRESSINGS) IMPLANT
STRIP CLOSURE SKIN 1/2X4 (GAUZE/BANDAGES/DRESSINGS) IMPLANT
SUCTION FRAZIER HANDLE 10FR (MISCELLANEOUS)
SUCTION TUBE FRAZIER 10FR DISP (MISCELLANEOUS) IMPLANT
SUT MNCRL 6-0 UNDY P1 1X18 (SUTURE) IMPLANT
SUT MNCRL AB 3-0 PS2 18 (SUTURE) IMPLANT
SUT MNCRL AB 4-0 PS2 18 (SUTURE) IMPLANT
SUT MON AB 5-0 P3 18 (SUTURE) IMPLANT
SUT MON AB 5-0 PS2 18 (SUTURE) IMPLANT
SUT MONOCRYL 6-0 P1 1X18 (SUTURE)
SUT PDS 3-0 CT2 (SUTURE) ×1
SUT PDS II 3-0 CT2 27 ABS (SUTURE) IMPLANT
SUT PROLENE 5 0 P 3 (SUTURE) IMPLANT
SUT PROLENE 5 0 PS 2 (SUTURE) IMPLANT
SUT PROLENE 6 0 P 1 18 (SUTURE) IMPLANT
SUT VIC AB 5-0 P-3 18X BRD (SUTURE) IMPLANT
SUT VIC AB 5-0 P3 18 (SUTURE)
SUT VIC AB 5-0 PS2 18 (SUTURE) IMPLANT
SUT VICRYL 4-0 PS2 18IN ABS (SUTURE) IMPLANT
SYR BULB EAR ULCER 3OZ GRN STR (SYRINGE) IMPLANT
SYR CONTROL 10ML LL (SYRINGE) ×1 IMPLANT
TOWEL GREEN STERILE FF (TOWEL DISPOSABLE) ×1 IMPLANT
TRAY DSU PREP LF (CUSTOM PROCEDURE TRAY) ×1 IMPLANT
TUBE CONNECTING 20X1/4 (TUBING) IMPLANT

## 2021-09-24 NOTE — Anesthesia Preprocedure Evaluation (Signed)
Anesthesia Evaluation  Patient identified by MRN, date of birth, ID band Patient awake    Reviewed: Allergy & Precautions, NPO status , Patient's Chart, lab work & pertinent test results  Airway Mallampati: II  TM Distance: >3 FB Neck ROM: Full    Dental no notable dental hx.    Pulmonary neg pulmonary ROS, Patient abstained from smoking.,    Pulmonary exam normal        Cardiovascular  Rhythm:Regular Rate:Normal     Neuro/Psych negative neurological ROS  negative psych ROS   GI/Hepatic negative GI ROS, (+)     substance abuse  alcohol use,   Endo/Other  negative endocrine ROS  Renal/GU negative Renal ROS     Musculoskeletal Left axillary mass   Abdominal Normal abdominal exam  (+)   Peds  Hematology negative hematology ROS (+)   Anesthesia Other Findings   Reproductive/Obstetrics                             Anesthesia Physical Anesthesia Plan  ASA: 2  Anesthesia Plan: General   Post-op Pain Management:    Induction: Intravenous  PONV Risk Score and Plan: 3 and Ondansetron, Dexamethasone, Midazolam and Treatment may vary due to age or medical condition  Airway Management Planned: Mask and LMA  Additional Equipment: None  Intra-op Plan:   Post-operative Plan: Extubation in OR  Informed Consent: I have reviewed the patients History and Physical, chart, labs and discussed the procedure including the risks, benefits and alternatives for the proposed anesthesia with the patient or authorized representative who has indicated his/her understanding and acceptance.     Dental advisory given  Plan Discussed with: CRNA  Anesthesia Plan Comments:         Anesthesia Quick Evaluation

## 2021-09-24 NOTE — Op Note (Signed)
DATE OF OPERATION: 09/24/2021  LOCATION: [facility]  PREOPERATIVE DIAGNOSIS: left axillary/breast accessory tissue  POSTOPERATIVE DIAGNOSIS: Same  PROCEDURE: excision of left axillary lipoma 8 x 8 cm  SURGEON: Pritika Alvarez Sanger Deyanira Fesler, DO  EBL: none  CONDITION: Stable  COMPLICATIONS: None  INDICATION: The patient, Catherine Paul, is a 26 y.o. female born on 10/01/1995, is here for treatment of a mass in the left axilla.  It looked like accessory breast or axillary tissue.   PROCEDURE DETAILS:  The patient was seen prior to surgery and marked.  The IV antibiotics were given. The patient was taken to the operating room and given a general anesthetic. A standard time out was performed and all information was confirmed by those in the room. SCDs were placed.  The left axilla was prepped and draped.  Local was injected.  The #15 blade was used to make an incision 3 cm long.  The mass was located and looked like a lipoma.  It was released from the surrounding tissue with the bovie.  Hemostasis was achieved with electrocautery.  The access skin was excised.  The mass was 8 x 8 cm.  The deep layers were closed with the 3-0 PDS followed by the 3-0 Monocryl and 4-0 Monocryl.  Derma bond was applied with a sterile dressing.  The patient was allowed to wake up and taken to recovery room in stable condition at the end of the case. The family was notified at the end of the case.

## 2021-09-24 NOTE — Interval H&P Note (Signed)
History and Physical Interval Note:  09/24/2021 1:18 PM  Catherine Paul  has presented today for surgery, with the diagnosis of Mass L axilla.  The various methods of treatment have been discussed with the patient and family. After consideration of risks, benefits and other options for treatment, the patient has consented to  Procedure(s) with comments: EXCISION MASS (Left) - requesting 30 mins as a surgical intervention.  The patient's history has been reviewed, patient examined, no change in status, stable for surgery.  I have reviewed the patient's chart and labs.  Questions were answered to the patient's satisfaction.     Loel Lofty Lenox Ladouceur

## 2021-09-24 NOTE — Discharge Instructions (Addendum)
INSTRUCTIONS FOR AFTER SURGERY   You will likely have some questions about what to expect following your operation.  The following information will help you and your family understand what to expect when you are discharged from the hospital.  Following these guidelines will help ensure a smooth recovery and reduce risks of complications.  Postoperative instructions include information on: diet, wound care, medications and physical activity.  AFTER SURGERY Expect to go home after the procedure.  In some cases, you may need to spend one night in the hospital for observation.  DIET Surgery does not require a specific diet.  However, the healthier you eat the better your body can start healing. It is important to increasing your protein intake.  This means limiting the foods with sugar and carbohydrates.  Focus on vegetables and some meat.  If you have any liposuction during your procedure be sure to drink water.  If your urine is bright yellow, then it is concentrated, and you need to drink more water.  As a general rule after surgery, you should have 8 ounces of water every hour while awake.  If you find you are persistently nauseated or unable to take in liquids let us know.  NO TOBACCO USE or EXPOSURE.  This will slow your healing process and increase the risk of a wound.  WOUND CARE After 3 days you can shower. Use a mild soap like Dial, Dove and [place]. If you have steri-strips / tape directly attached to your skin leave them in place. It is OK to get these wet.   No baths, pools or hot tubs for four weeks. We close your incision to leave the smallest and best-looking scar. No ointment or creams on your incisions until given the go ahead.  Especially not Neosporin (Too many skin reactions with this one).  A few weeks after surgery you can use Mederma and start massaging the scar.  ACTIVITY No heavy lifting until cleared by the doctor.  This usually means no more than a half-gallon of milk.  It is  OK to walk and climb stairs. In fact, moving your legs is very important to decrease your risk of a blood clot.  It will also help keep you from getting deconditioned.  Every 1 to 2 hours get up and walk for 5 minutes. This will help with a quicker recovery back to normal.  Let pain be your guide so you don't do too much.  This is not the time for spring cleaning and don't plan on taking care of anyone else.  This time is for you to recover,  You will be more comfortable if you sleep and rest with your head elevated either with a few pillows under you or in a recliner.  No stomach sleeping for a three months.  WORK Everyone returns to work at different times. As a rough guide, most people take at least 1 - 2 weeks off prior to returning to work. If you need documentation for your job, bring the forms to your postoperative follow up visit.  DRIVING Arrange for someone to bring you home from the hospital.  You may be able to drive a few days after surgery but not while taking any narcotics or valium.  BOWEL MOVEMENTS Constipation can occur after anesthesia and while taking pain medication.  It is important to stay ahead for your comfort.  We recommend taking Milk of Magnesia (2 tablespoons; twice a day) while taking the pain pills.  MEDICATIONS You may  be prescribed should start after surgery At your preoperative visit for you history and physical you may have been given the following medications: An antibiotic: Start this medication when you get home and take according to the instructions on the bottle. Zofran 4 mg:  This is to treat nausea and vomiting.  You can take this every 6 hours as needed and only if needed. Valium 2 mg: This is for muscle tightness if you have an implant or expander. This will help relax your muscle which also helps with pain control.  This can be taken every 12 hours as needed. Don't drive after taking this medication. Norco (hydrocodone/acetaminophen) 5/325 mg:  This is  only to be used after you have taken the motrin or the tylenol. Every 8 hours as needed.  Over the counter Medication to take: Ibuprofen (Motrin) 600 mg:  Take this every 6 hours.  If you have additional pain then take 500 mg of the tylenol every 8 hours.  Only take the Norco after you have tried these two. Miralax or stool softener of choice: Take this according to the bottle if you take the [place] Call your surgeon's office if any of the following occur: Fever 101 degrees F or greater Excessive bleeding or fluid from the incision site. Pain that increases over time without aid from the medications Redness, warmth, or pus draining from incision sites Persistent nausea or inability to take in liquids Severe misshapen area that underwent the operation.   Post Anesthesia Home Care Instructions  Activity: Get plenty of rest for the remainder of the day. A responsible individual must stay with you for 24 hours following the procedure.  For the next 24 hours, DO NOT: -Drive a car -Paediatric nurse -Drink alcoholic beverages -Take any medication unless instructed by your physician -Make any legal decisions or sign important papers.  Meals: Start with liquid foods such as gelatin or soup. Progress to regular foods as tolerated. Avoid greasy, spicy, heavy foods. If nausea and/or vomiting occur, drink only clear liquids until the nausea and/or vomiting subsides. Call your physician if vomiting continues.  Special Instructions/Symptoms: Your throat may feel dry or sore from the anesthesia or the breathing tube placed in your throat during surgery. If this causes discomfort, gargle with warm salt water. The discomfort should disappear within 24 hours.  If you had a scopolamine patch placed behind your ear for the management of post- operative nausea and/or vomiting:  1. The medication in the patch is effective for 72 hours, after which it should be removed.  Wrap patch in a  tissue and discard in the trash. Wash hands thoroughly with soap and water. 2. You may remove the patch earlier than 72 hours if you experience unpleasant side effects which may include dry mouth, dizziness or visual disturbances. 3. Avoid touching the patch. Wash your hands with soap and water after contact with the patch.

## 2021-09-24 NOTE — Anesthesia Procedure Notes (Signed)
Procedure Name: LMA Insertion Date/Time: 09/24/2021 2:12 PM  Performed by: Tawni Millers, CRNAPre-anesthesia Checklist: Patient identified, Emergency Drugs available, Suction available and Patient being monitored Patient Re-evaluated:Patient Re-evaluated prior to induction Oxygen Delivery Method: Circle system utilized Preoxygenation: Pre-oxygenation with 100% oxygen Induction Type: IV induction Ventilation: Mask ventilation without difficulty LMA: LMA inserted LMA Size: 4.0 Number of attempts: 1 Airway Equipment and Method: Bite block Placement Confirmation: positive ETCO2 Tube secured with: Tape Dental Injury: Teeth and Oropharynx as per pre-operative assessment

## 2021-09-24 NOTE — Transfer of Care (Signed)
Immediate Anesthesia Transfer of Care Note  Patient: Catherine Paul  Procedure(s) Performed: EXCISION OF LEFT MASS/ACCESSORY BREAST TISSUE (Left: Axilla)  Patient Location: PACU  Anesthesia Type:General  Level of Consciousness: sedated  Airway & Oxygen Therapy: Patient Spontanous Breathing and Patient connected to face mask oxygen  Post-op Assessment: Report given to RN and Post -op Vital signs reviewed and stable  Post vital signs: Reviewed and stable  Last Vitals:  Vitals Value Taken Time  BP    Temp    Pulse 83 09/24/21 1459  Resp 15 09/24/21 1459  SpO2 100 % 09/24/21 1459  Vitals shown include unvalidated device data.  Last Pain:  Vitals:   09/24/21 1221  TempSrc: Oral  PainSc: 0-No pain      Patients Stated Pain Goal: 3 (49/32/41 [id number])  Complications: No notable events documented.

## 2021-09-25 ENCOUNTER — Encounter (HOSPITAL_BASED_OUTPATIENT_CLINIC_OR_DEPARTMENT_OTHER): Payer: Self-pay | Admitting: Plastic Surgery

## 2021-09-25 NOTE — Anesthesia Postprocedure Evaluation (Signed)
Anesthesia Post Note  Patient: Catherine Paul  Procedure(s) Performed: EXCISION OF LEFT MASS/ACCESSORY BREAST TISSUE (Left: Axilla)     Patient location during evaluation: PACU Anesthesia Type: General Level of consciousness: awake and alert Pain management: pain level controlled Vital Signs Assessment: post-procedure vital signs reviewed and stable Respiratory status: spontaneous breathing, nonlabored ventilation, respiratory function stable and patient connected to nasal cannula oxygen Cardiovascular status: blood pressure returned to baseline and stable Postop Assessment: no apparent nausea or vomiting Anesthetic complications: no   No notable events documented.  Last Vitals:  Vitals:   09/24/21 1545 09/24/21 1606  BP: (!) 124/92 125/86  Pulse: 89 89  Resp: 15 20  Temp:  36.6 C  SpO2: 98% 99%    Last Pain:  Vitals:   09/24/21 1606  TempSrc: Oral  PainSc: 2                  March Rummage Catherine Paul

## 2021-09-28 LAB — SURGICAL PATHOLOGY

## 2021-10-02 ENCOUNTER — Telehealth: Payer: Self-pay

## 2021-10-02 ENCOUNTER — Encounter: Payer: Self-pay | Admitting: Plastic Surgery

## 2021-10-02 NOTE — Telephone Encounter (Signed)
Patient called in stating the top part of the bandage is coming off and cant remember if she has steri strips or not.

## 2021-10-06 NOTE — Progress Notes (Unsigned)
   Referring Provider No referring provider defined for this encounter.   CC: No chief complaint on file.     Catherine Paul is an 26 y.o. female.  HPI: Patient is a 26 year old female here for follow-up after excision of left axillary lipoma with Dr. Marla Roe on 09/24/2021.  The lipoma was 8 x 8 cm.  Pathology showed: Benign breast tissue and benign adipose tissue.  Negative for malignancy.  Review of Systems General: ***  Physical Exam    09/24/2021    4:06 PM 09/24/2021    3:45 PM 09/24/2021    3:30 PM  Vitals with BMI  Systolic [phone]  Diastolic 86 92 86  Pulse 89 89 76    General:  No acute distress,  Alert and oriented, Non-Toxic, Normal speech and affect ***   Assessment/Plan ***  Carola Rhine Lakoda Raske 10/06/2021, 2:39 PM

## 2021-10-07 ENCOUNTER — Encounter: Payer: BC Managed Care – PPO | Admitting: Student

## 2021-10-07 ENCOUNTER — Ambulatory Visit (INDEPENDENT_AMBULATORY_CARE_PROVIDER_SITE_OTHER): Payer: BC Managed Care – PPO | Admitting: Surgical

## 2021-10-07 DIAGNOSIS — R2232 Localized swelling, mass and lump, left upper limb: Secondary | ICD-10-CM

## 2021-10-07 NOTE — Telephone Encounter (Signed)
Seen in office today  

## 2021-10-22 NOTE — Progress Notes (Signed)
Patient is a very pleasant 26 year old female with PMH of left axillary lipoma s/p excision performed 09/24/2021 Dr. Marla Roe who presents to clinic for postoperative follow-up.  She was last seen here in clinic on 10/07/2021.  At that time she had no complaints and exam was entirely reassuring.    Today, she is doing well.  No specific complaints.  States that her Steri-Strips fell off spontaneously.  She feels as though her stitches have all dissolved or fallen out.  Reviewed operative report and the excision site was closed with Monocryl superficially.  She denies any pain and states that she is quite pleased with the cosmetic improvement.  Physical exam is entirely reassuring.  Excision site is well approximated and appears to be completely healed.  No surrounding erythema or irritation.  Nontender.  Discussed scar creams.  Picture(s) obtained of the patient and placed in the chart were with the patient's or guardian's permission.  Follow-up only as needed.

## 2021-10-23 ENCOUNTER — Encounter: Payer: Self-pay | Admitting: Physician Assistant

## 2021-10-23 ENCOUNTER — Ambulatory Visit (INDEPENDENT_AMBULATORY_CARE_PROVIDER_SITE_OTHER): Payer: BC Managed Care – PPO | Admitting: Physician Assistant

## 2021-10-23 DIAGNOSIS — R2232 Localized swelling, mass and lump, left upper limb: Secondary | ICD-10-CM

## 2021-12-28 ENCOUNTER — Ambulatory Visit (INDEPENDENT_AMBULATORY_CARE_PROVIDER_SITE_OTHER): Payer: BC Managed Care – PPO | Admitting: *Deleted

## 2021-12-28 VITALS — BP 135/84 | HR 101

## 2021-12-28 DIAGNOSIS — Z3201 Encounter for pregnancy test, result positive: Secondary | ICD-10-CM | POA: Diagnosis not present

## 2021-12-28 LAB — POCT URINE PREGNANCY: Preg Test, Ur: POSITIVE — AB

## 2021-12-28 NOTE — Progress Notes (Signed)
Catherine Paul presents today for UPT. She has no unusual complaints. LMP:    OBJECTIVE: Appears well, in no apparent distress.  OB History     Gravida  2   Para  1   Term  1   Preterm      AB      Living  1      SAB      IAB      Ectopic      Multiple  0   Live Births  1          Home UPT Result: Positive In-Office UPT result: Positive I have reviewed the patient's medical, obstetrical, social, and family histories, and medications.   ASSESSMENT: Positive pregnancy test  PLAN Prenatal care to be completed at: [facility] Pt taking OTC PNV. No complaints of nausea. OB US/Intake and New OB appt scheduled at checkout. Pt given ectopic and SAB precautions.

## 2022-01-30 ENCOUNTER — Other Ambulatory Visit: Payer: Self-pay

## 2022-01-30 ENCOUNTER — Emergency Department (HOSPITAL_BASED_OUTPATIENT_CLINIC_OR_DEPARTMENT_OTHER): Payer: BC Managed Care – PPO

## 2022-01-30 ENCOUNTER — Emergency Department (HOSPITAL_BASED_OUTPATIENT_CLINIC_OR_DEPARTMENT_OTHER)
Admission: EM | Admit: 2022-01-30 | Discharge: 2022-01-30 | Disposition: A | Discharge status: Home / Self Care | Payer: BC Managed Care – PPO | Attending: Emergency Medicine | Admitting: Emergency Medicine

## 2022-01-30 ENCOUNTER — Encounter (HOSPITAL_BASED_OUTPATIENT_CLINIC_OR_DEPARTMENT_OTHER): Payer: Self-pay | Admitting: Emergency Medicine

## 2022-01-30 DIAGNOSIS — O2391 Unspecified genitourinary tract infection in pregnancy, first trimester: Secondary | ICD-10-CM | POA: Insufficient documentation

## 2022-01-30 DIAGNOSIS — O99891 Other specified diseases and conditions complicating pregnancy: Secondary | ICD-10-CM

## 2022-01-30 DIAGNOSIS — O0289 Other abnormal products of conception: Secondary | ICD-10-CM | POA: Diagnosis not present

## 2022-01-30 DIAGNOSIS — O219 Vomiting of pregnancy, unspecified: Secondary | ICD-10-CM | POA: Insufficient documentation

## 2022-01-30 DIAGNOSIS — Z3A09 9 weeks gestation of pregnancy: Secondary | ICD-10-CM | POA: Insufficient documentation

## 2022-01-30 DIAGNOSIS — R197 Diarrhea, unspecified: Secondary | ICD-10-CM | POA: Insufficient documentation

## 2022-01-30 DIAGNOSIS — Z20822 Contact with and (suspected) exposure to covid-19: Secondary | ICD-10-CM | POA: Diagnosis not present

## 2022-01-30 DIAGNOSIS — R109 Unspecified abdominal pain: Secondary | ICD-10-CM | POA: Insufficient documentation

## 2022-01-30 DIAGNOSIS — R112 Nausea with vomiting, unspecified: Secondary | ICD-10-CM

## 2022-01-30 LAB — CBC
HCT: 42.6 % (ref 36.0–46.0)
Hemoglobin: 15.2 g/dL — ABNORMAL HIGH (ref 12.0–15.0)
MCH: 29.5 pg (ref 26.0–34.0)
MCHC: 35.7 g/dL (ref 30.0–36.0)
MCV: 82.7 fL (ref 80.0–100.0)
Platelets: 251 10*3/uL (ref 150–400)
RBC: 5.15 MIL/uL — ABNORMAL HIGH (ref 3.87–5.11)
RDW: 12.8 % (ref 11.5–15.5)
WBC: 8.2 10*3/uL (ref 4.0–10.5)
nRBC: 0 % (ref 0.0–0.2)

## 2022-01-30 LAB — COMPREHENSIVE METABOLIC PANEL
ALT: 30 U/L (ref 0–44)
AST: 33 U/L (ref 15–41)
Albumin: 3.7 g/dL (ref 3.5–5.0)
Alkaline Phosphatase: 102 U/L (ref 38–126)
Anion gap: 8 (ref 5–15)
BUN: 6 mg/dL (ref 6–20)
CO2: 21 mmol/L — ABNORMAL LOW (ref 22–32)
Calcium: 8.9 mg/dL (ref 8.9–10.3)
Chloride: 105 mmol/L (ref 98–111)
Creatinine, Ser: 0.44 mg/dL (ref 0.44–1.00)
GFR, Estimated: >60 mL/min (ref >60)
Glucose, Bld: 120 mg/dL — ABNORMAL HIGH (ref 70–99)
Potassium: 3.4 mmol/L — ABNORMAL LOW (ref 3.5–5.1)
Sodium: 134 mmol/L — ABNORMAL LOW (ref 135–145)
Total Bilirubin: 0.5 mg/dL (ref 0.3–1.2)
Total Protein: 7.6 g/dL (ref 6.5–8.1)

## 2022-01-30 LAB — HCG, QUANTITATIVE, PREGNANCY: hCG, Beta Chain, Quant, S: 187926 m[IU]/mL — ABNORMAL HIGH (ref <5)

## 2022-01-30 LAB — URINALYSIS, ROUTINE W REFLEX MICROSCOPIC
Bilirubin Urine: NEGATIVE
Glucose, UA: NEGATIVE mg/dL
Ketones, ur: NEGATIVE mg/dL
Nitrite: NEGATIVE
Protein, ur: NEGATIVE mg/dL
Specific Gravity, Urine: 1.01 (ref 1.005–1.030)
pH: 6 (ref 5.0–8.0)

## 2022-01-30 LAB — LIPASE, BLOOD: Lipase: 33 U/L (ref 11–51)

## 2022-01-30 LAB — URINALYSIS, MICROSCOPIC (REFLEX): WBC, UA: >50 WBC/hpf (ref 0–5)

## 2022-01-30 LAB — RESP PANEL BY RT-PCR (RSV, FLU A&B, COVID)  RVPGX2
Influenza A by PCR: NEGATIVE
Influenza B by PCR: NEGATIVE
Resp Syncytial Virus by PCR: NEGATIVE
SARS Coronavirus 2 by RT PCR: NEGATIVE

## 2022-01-30 MED ORDER — CEPHALEXIN 500 MG PO CAPS: 500.0000 mg | ORAL_CAPSULE | Freq: Three times a day (TID) | ORAL | 0 refills | Status: AC

## 2022-01-30 NOTE — Discharge Instructions (Addendum)
You were seen in the emergency department today for nausea, vomiting and diarrhea.  You do have a UTI and this may have caused some of your symptoms.  We performed an ultrasound here which shows a single live intrauterine pregnancy and no abnormal findings.  We are providing you with a prescription for cephalexin (Keflex) which she will take 3 times a day for the next 5 days.  Please follow-up with your OB/GYN, Dr. Elly Modena at your next scheduled appointment.  Please return to emergency department for worsening symptoms or you can go to the maternal assessment unit/MAU at [facility] after trip street where they have OB nurse practitioners and physicians present who can assess you and baby..  Please drink plenty of fluids at home to stay hydrated especially in pregnancy.

## 2022-01-30 NOTE — ED Notes (Signed)
D/c paperwork reviewed with pt, including follow up care. Pt verbalized understanding, Ambulatory without assistance to ED exit, NAD.

## 2022-01-30 NOTE — ED Provider Notes (Cosign Needed)
[facility] EMERGENCY DEPARTMENT Provider Note   CSN: [id number] Arrival date & time: 01/30/22  1235     History  Chief Complaint  Patient presents with   Abdominal Pain    Catherine Paul is a 26 y.o. female.  G2, P1, [redacted] weeks pregnant who presents to the emergency department with abdominal pain.  Patient states that she began feeling generally unwell on Wednesday night.  She states that she had some mild nausea.  She states that Thursday morning she woke up with nausea, vomiting, diarrhea.  She states that throughout the day she could not tolerate any p.o.  She states that after 8 PM on Thursday night she was able to have some water and grapes which she was able to keep down.  She states that since then she has had improvement in her symptoms but has had some intermittent abdominal pain that is sharp and cramping.  She states that she "feels like she needs to crawl up" and the pain is present.  The pain is just above the umbilicus.  Not radiating.  Additionally she states that yesterday she had pink-tinged discharge that only occurred once and has not returned.  She is tolerating grapes, apples, water.  She tried ginger ale this morning and had nausea.  Denies fevers, dysuria, shortness of breath, syncope, significant vaginal bleeding, hematochezia, melena.  [redacted] weeks pregnant and [facility]  She is to have ultrasound on 02/09/2021.  Last menstrual period 11/28/2021.  She is on prenatal vitamins.   Abdominal Pain Associated symptoms: diarrhea, nausea and vomiting   Associated symptoms: no chills, no cough, no dysuria, no fever, no hematuria and no vaginal discharge        Home Medications Prior to Admission medications   Medication Sig Start Date End Date Taking? Authorizing Provider  cephALEXin (KEFLEX) 500 MG capsule Take 1 capsule (500 mg total) by mouth 3 (three) times daily for 5 days. 01/30/22 02/04/22 Yes Mickie Hillier, PA-C  Prenatal  Vit-Fe Fumarate-FA (PRENATAL VITAMINS PO) Take 1 tablet by mouth daily.    [provider]      Allergies    Patient has no known allergies.    Review of Systems   Review of Systems  Constitutional:  Positive for appetite change. Negative for chills and fever.  Respiratory:  Negative for cough.   Cardiovascular:  Negative for palpitations.  Gastrointestinal:  Positive for abdominal pain, diarrhea, nausea and vomiting.  Genitourinary:  Negative for dysuria, hematuria and vaginal discharge.  Neurological:  Negative for syncope.  All other systems reviewed and are negative.   Physical Exam Updated Vital Signs BP 129/82   Pulse 95   Temp 98.3 F (36.8 C)   Resp 16   Ht '5\' 4"'$  (1.626 m)   Wt 87.5 kg   LMP 11/28/2021   SpO2 98%   BMI 33.13 kg/m  Physical Exam Vitals and nursing note reviewed.  Constitutional:      General: She is not in acute distress.    Appearance: She is well-developed. She is not ill-appearing or toxic-appearing.  HENT:     Head: Normocephalic and atraumatic.  Eyes:     General: No scleral icterus.    Extraocular Movements: Extraocular movements intact.  Cardiovascular:     Rate and Rhythm: Normal rate and regular rhythm.     Heart sounds: Normal heart sounds. No murmur heard. Pulmonary:     Effort: Pulmonary effort is normal. No respiratory distress.  Breath sounds: Normal breath sounds.  Abdominal:     General: Abdomen is protuberant. Bowel sounds are normal. There is no distension.     Palpations: Abdomen is soft.     Tenderness: There is no abdominal tenderness. There is no right CVA tenderness, left CVA tenderness or guarding. Negative signs include Murphy's sign.  Skin:    General: Skin is warm and dry.     Capillary Refill: Capillary refill takes less than 2 seconds.     Findings: No rash.  Neurological:     General: No focal deficit present.     Mental Status: She is alert and oriented to person, place, and time.  Psychiatric:         Mood and Affect: Mood normal.        Behavior: Behavior normal.        Thought Content: Thought content normal.        Judgment: Judgment normal.     ED Results / Procedures / Treatments   Labs (all labs ordered are listed, but only abnormal results are displayed) Labs Reviewed  COMPREHENSIVE METABOLIC PANEL - Abnormal; Notable for the following components:      Result Value   Sodium 134 (*)    Potassium 3.4 (*)    CO2 21 (*)    Glucose, Bld 120 (*)    All other components within normal limits  CBC - Abnormal; Notable for the following components:   RBC 5.15 (*)    Hemoglobin 15.2 (*)    All other components within normal limits  URINALYSIS, ROUTINE W REFLEX MICROSCOPIC - Abnormal; Notable for the following components:   Color, Urine STRAW (*)    APPearance CLOUDY (*)    Hgb urine dipstick SMALL (*)    Leukocytes,Ua LARGE (*)    All other components within normal limits  HCG, QUANTITATIVE, PREGNANCY - Abnormal; Notable for the following components:   hCG, Beta Chain, Quant, S 187,926 (*)    All other components within normal limits  URINALYSIS, MICROSCOPIC (REFLEX) - Abnormal; Notable for the following components:   Bacteria, UA MANY (*)    All other components within normal limits  RESP PANEL BY RT-PCR (RSV, FLU A&B, COVID)  RVPGX2  LIPASE, BLOOD    EKG None  Radiology US OB Comp Less 14 Wks  Result Date: 01/30/2022 CLINICAL DATA:  Abdominal pain vomiting, and diarrhea for several days. 1st trimester pregnancy. EXAM: OBSTETRIC <14 WK ULTRASOUND TECHNIQUE: Transabdominal ultrasound was performed for evaluation of the gestation as well as the maternal uterus and adnexal regions. COMPARISON:  None Available. FINDINGS: Intrauterine gestational sac: Single Yolk sac:  Visualized. Embryo:  Visualized. Cardiac Activity: Visualized. Heart Rate: 163 bpm CRL:   28 mm   9 w 4 d                  [place] EDC: 08/31/2022 Subchorionic hemorrhage:  None visualized. Maternal  uterus/adnexae: Both ovaries are normal in appearance. No mass or abnormal free fluid identified. IMPRESSION: Single living IUP with estimated gestational age of 108 weeks 4 days, and [place] EDC of 08/31/2022. No maternal uterine or adnexal abnormality identified. Electronically Signed   By: Marlaine Hind M.D.   On: 01/30/2022 16:14    Procedures Procedures   Medications Ordered in ED Medications - No data to display  ED Course/ Medical Decision Making/ A&P Clinical Course as of 01/30/22 1747  Sat Jan 30, 2022  1522 Pregnant, bactiuria.  Not QD likely pelvic/US  [CC]  1624 Ultrasound okay. Covid flu/dispo  [CC]    Clinical Course User Index [CC] Tretha Sciara, MD                           Medical Decision Making Amount and/or Complexity of Data Reviewed Labs: ordered. Radiology: ordered.  Initial Impression and Ddx 26 year old female who presents to the emergency department with nausea, vomiting, diarrhea since Thursday.  She is overall well-appearing, nonseptic, nontoxic in appearance.  She is hemodynamically stable.  She has a protuberant but not obvious gravid abdomen.  Abdomen is soft.  There is no peritonitis. Patient PMH that increases complexity of ED encounter: None Differential: Morning sickness, gastroenteritis, Crohn's, UC, UTI, ectopic, PID, TOA, torsion  Interpretation of Diagnostics I independent reviewed and interpreted the labs as followed: UA consistent with UTI  - I independently visualized the following imaging with scope of interpretation limited to determining acute life threatening conditions related to emergency care: Ultrasound, which revealed single living IUP  Patient Reassessment and Ultimate Disposition/Management Overall well-appearing. Labs with evidence of vomiting with CO2 at 21, potassium is mildly decreased at 3.4. Lipase is negative so doubt pancreatitis.  There is no LFT elevation or elevated bilirubin concerning for acute hepatobiliary  disease She is [redacted] weeks gestation and has not had morning nausea and vomiting so doubt this being morning sickness. UA is consistent with UTI.  She is asymptomatic.  Given the nausea, vomiting, diarrhea can be evidence of viral infection and son was sick earlier this week will obtain COVID and flu and RSV.  Passed p.o. trial here without difficulty.  Her respiratory panel is negative for COVID, flu, RSV. She has no new vaginal discharge concerning for PID, TOA.  Symptoms are inconsistent with torsion.  Symptoms are also inconsistent with other etiologies of abdominal pain, nausea, vomiting, diarrhea like SBO, infectious diarrhea, diverticulitis, Crohn's, ulcerative colitis.  She has no flank pain so I have low suspicion for pyelonephritis, stone, obstructed stone, infected stone at this time.  Will treat asymptomatic bacteria with Keflex.  She has a follow-up appoint with her OB/GYN, Dr. Elly Modena on 02/09/2021.  Instructed to continue prenatal vitamins.  Given return precautions for worsening symptoms.  We discussed her also following up at the [facility]U if she has return of symptoms as they can evaluate her and fetus at that time.  She verbalized understanding.  The patient has been appropriately medically screened and/or stabilized in the ED. I have low suspicion for any other emergent medical condition which would require further screening, evaluation or treatment in the ED or require inpatient management. At time of discharge the patient is hemodynamically stable and in no acute distress. I have discussed work-up results and diagnosis with patient and answered all questions. Patient is agreeable with discharge plan. We discussed strict return precautions for returning to the emergency department and they verbalized understanding.    Patient management required discussion with the following services or consulting groups:  None  Complexity of Problems Addressed Acute complicated illness or  Injury  Additional Data Reviewed and Analyzed Further history obtained from: Further history from spouse/family member, Care Everywhere, and Prior labs/imaging results  Patient Encounter Risk Assessment Prescriptions and SDOH impact on management  Final Clinical Impression(s) / ED Diagnoses Final diagnoses:  Nausea vomiting and diarrhea  Asymptomatic bacteriuria during pregnancy    Rx / DC Orders ED Discharge Orders          Ordered    cephALEXin (  KEFLEX) 500 MG capsule  3 times daily        01/30/22 1747              Mickie Hillier, PA-C 01/30/22 1747

## 2022-01-30 NOTE — ED Triage Notes (Signed)
Pt w/ NVD on Thurs; since then, has not been able to eat, but denies vomiting/diarrhea; some "light" abd pain, indicates upper and lower abd; some light pink discharge yesterday, none today

## 2022-02-01 NOTE — L&D Delivery Note (Signed)
OB/GYN Faculty Practice Delivery Note  Catherine Paul is a 27 y.o. [id number] s/p SVD at [redacted]w[redacted]d. She was admitted for SOL.   ROM: rupture date, rupture time, delivery date, or delivery time have not been documented with clear fluid GBS Status:  Negative/-- (07/11 1214) Maximum Maternal Temperature:  Temp (24hrs), Avg:98.2 F (36.8 C), Min:98 F (36.7 C), Max:98.4 F (36.9 C)    Labor Progress: Patient arrived at 8 cm dilation and progressed spontaneously.  Delivery Date/Time: 08/26/2022 at 0227 Delivery: Called to room and patient was complete and pushing. Head delivered in LOA position. No nuchal cord present. Shoulder and body delivered in usual fashion. Infant with spontaneous cry, placed on mother's abdomen, dried and stimulated. Cord clamped x 2 after 1-minute delay, and cut by FOB. Cord blood drawn. Placenta delivered spontaneously with gentle cord traction. Fundus firm with massage and Pitocin. Labia, perineum, vagina, and cervix inspected with first-degree perineal laceration repaired with 3-0 Vicryl suture..   Placenta:  spontaneous, intact, 3 vessel cord  Complications: None Lacerations: First-degree perineal repaired EBL: 42 mL Analgesia: Epidural  Infant: APGAR (1 MIN): 9  APGAR (5 MINS): 9  APGAR (10 MINS):    Weight: 3440 g  Derrel Nip, MD  OB Fellow  08/26/2022 3:34 AM

## 2022-02-09 ENCOUNTER — Other Ambulatory Visit: Payer: Self-pay | Admitting: Obstetrics and Gynecology

## 2022-02-09 ENCOUNTER — Encounter: Payer: Self-pay | Admitting: Obstetrics and Gynecology

## 2022-02-09 ENCOUNTER — Ambulatory Visit: Payer: BC Managed Care – PPO | Admitting: *Deleted

## 2022-02-09 VITALS — BP 118/80 | HR 86 | Ht 64.0 in | Wt 187.1 lb

## 2022-02-09 DIAGNOSIS — Z3480 Encounter for supervision of other normal pregnancy, unspecified trimester: Secondary | ICD-10-CM

## 2022-02-09 NOTE — Progress Notes (Signed)
New OB Intake  I connected withNAME@  on 02/09/22 at  2:25 PM EST by In Person Visit and verified that I am speaking with the correct person using two identifiers. Nurse is located at [facility] and pt is located at [place].  I discussed the limitations, risks, security and privacy concerns of performing an evaluation and management service by telephone and the availability of in person appointments. I also discussed with the patient that there may be a patient responsible charge related to this service. The patient expressed understanding and agreed to proceed.  I explained I am completing New OB Intake today. We discussed EDD of 09/04/22 that is based on LMP of 11/28/21. Pt is G2/P1. I reviewed her allergies, medications, Medical/Surgical/OB history, and appropriate screenings. I informed her of [facility] services. [facility] information placed in AVS. Based on history, this is a low risk pregnancy.  Patient Active Problem List   Diagnosis Date Noted   Mass of axilla, left 08/21/2021   Gestational hypertension 03/13/2020   Vacuum-assisted vaginal delivery 03/13/2020   [redacted] weeks gestation of pregnancy 03/03/2020   Sciatica without back pain 11/15/2019   Encounter for supervision of normal first pregnancy in third trimester 09/05/2019    Concerns addressed today  Delivery Plans Plans to deliver at [facility]. Patient given information for [facility] Healthy [facility] website for more information about [facility] and [place]. Patient is not interested in water birth. Offered upcoming OB visit with CNM to discuss further.  MyChart/Babyscripts MyChart access verified. I explained pt will have some visits in office and some virtually. Babyscripts instructions given and order placed. Patient verifies receipt of registration text/e-mail. Account successfully created and app downloaded.  Blood Pressure Cuff/Weight Scale Pt has BP cuff at home. Explained after first prenatal appt pt will check weekly and document in  4. Patient does not have weight scale; patient may purchase if they desire to track weight weekly in Babyscripts.  Anatomy US Explained first scheduled [place] will be around 19 weeks. Anatomy US scheduled for 19 wks at [facility]. Pt notified to arrive at [facility].  Labs Discussed Johnsie Cancel genetic screening with patient. Would like both Panorama and Horizon drawn at new OB visit. Routine prenatal labs needed.  COVID Vaccine Patient has not had COVID vaccine.   Social Determinants of Health Food Insecurity: Patient denies food insecurity. WIC Referral: Patient is interested in referral to [facility].  Transportation: Patient denies transportation needs. Childcare: Discussed no children allowed at ultrasound appointments. Offered childcare services; patient expresses need for childcare services. Childcare scheduled for appropriate appointments and information given to patient.  First visit review I reviewed new OB appt with patient. I explained they will have a provider visit that includes pap, GC/CC, OB Urine, Natera labs. Explained pt will be seen by Dr. Elly Modena at first visit; encounter routed to appropriate provider. Explained that patient will be seen by pregnancy navigator following visit with provider.   Penny Pia, RN 02/09/2022  2:31 PM

## 2022-02-10 LAB — CBC/D/PLT+RPR+RH+ABO+RUBIGG...
Antibody Screen: NEGATIVE
Basophils Absolute: 0 10*3/uL (ref 0.0–0.2)
Basos: 1 %
EOS (ABSOLUTE): 0.1 10*3/uL (ref 0.0–0.4)
Eos: 2 %
HCV Ab: NONREACTIVE
HIV Screen 4th Generation wRfx: NONREACTIVE
Hematocrit: 41.7 % (ref 34.0–46.6)
Hemoglobin: 14.4 g/dL (ref 11.1–15.9)
Hepatitis B Surface Ag: NEGATIVE
Immature Grans (Abs): 0 10*3/uL (ref 0.0–0.1)
Immature Granulocytes: 0 %
Lymphocytes Absolute: 1.7 10*3/uL (ref 0.7–3.1)
Lymphs: 20 %
MCH: 29.4 pg (ref 26.6–33.0)
MCHC: 34.5 g/dL (ref 31.5–35.7)
MCV: 85 fL (ref 79–97)
Monocytes Absolute: 0.6 10*3/uL (ref 0.1–0.9)
Monocytes: 7 %
Neutrophils Absolute: 6 10*3/uL (ref 1.4–7.0)
Neutrophils: 70 %
Platelets: 273 10*3/uL (ref 150–450)
RBC: 4.9 x10E6/uL (ref 3.77–5.28)
RDW: 13 % (ref 11.7–15.4)
RPR Ser Ql: NONREACTIVE
Rh Factor: POSITIVE
Rubella Antibodies, IGG: 4.06 index (ref >0.99)
WBC: 8.5 10*3/uL (ref 3.4–10.8)

## 2022-02-10 LAB — COMPREHENSIVE METABOLIC PANEL
ALT: 16 IU/L (ref 0–32)
AST: 16 IU/L (ref 0–40)
Albumin/Globulin Ratio: 1.9 (ref 1.2–2.2)
Albumin: 4.2 g/dL (ref 4.0–5.0)
Alkaline Phosphatase: 110 IU/L (ref 44–121)
BUN/Creatinine Ratio: 9 (ref 9–23)
BUN: 4 mg/dL — ABNORMAL LOW (ref 6–20)
Bilirubin Total: 0.3 mg/dL (ref 0.0–1.2)
CO2: 19 mmol/L — ABNORMAL LOW (ref 20–29)
Calcium: 8.9 mg/dL (ref 8.7–10.2)
Chloride: 102 mmol/L (ref 96–106)
Creatinine, Ser: 0.47 mg/dL — ABNORMAL LOW (ref 0.57–1.00)
Globulin, Total: 2.2 g/dL (ref 1.5–4.5)
Glucose: 99 mg/dL (ref 70–99)
Potassium: 4.1 mmol/L (ref 3.5–5.2)
Sodium: 136 mmol/L (ref 134–144)
Total Protein: 6.4 g/dL (ref 6.0–8.5)
eGFR: 135 mL/min/{1.73_m2} (ref >59)

## 2022-02-10 LAB — HCV INTERPRETATION

## 2022-02-22 ENCOUNTER — Other Ambulatory Visit (HOSPITAL_COMMUNITY)
Admission: RE | Admit: 2022-02-22 | Discharge: 2022-02-22 | Disposition: A | Discharge status: Home / Self Care | Payer: BC Managed Care – PPO | Source: Ambulatory Visit | Attending: Obstetrics and Gynecology | Admitting: Obstetrics and Gynecology

## 2022-02-22 ENCOUNTER — Ambulatory Visit (INDEPENDENT_AMBULATORY_CARE_PROVIDER_SITE_OTHER): Payer: BC Managed Care – PPO | Admitting: Obstetrics and Gynecology

## 2022-02-22 ENCOUNTER — Encounter: Payer: Self-pay | Admitting: Obstetrics and Gynecology

## 2022-02-22 ENCOUNTER — Ambulatory Visit (INDEPENDENT_AMBULATORY_CARE_PROVIDER_SITE_OTHER): Payer: Medicaid Other | Admitting: Licensed Clinical Social Worker

## 2022-02-22 VITALS — BP 125/83 | HR 102 | Wt 184.0 lb

## 2022-02-22 DIAGNOSIS — Z3481 Encounter for supervision of other normal pregnancy, first trimester: Secondary | ICD-10-CM

## 2022-02-22 DIAGNOSIS — Z8759 Personal history of other complications of pregnancy, childbirth and the puerperium: Secondary | ICD-10-CM

## 2022-02-22 DIAGNOSIS — Z3A12 12 weeks gestation of pregnancy: Secondary | ICD-10-CM

## 2022-02-22 DIAGNOSIS — Z3480 Encounter for supervision of other normal pregnancy, unspecified trimester: Secondary | ICD-10-CM | POA: Insufficient documentation

## 2022-02-22 MED ORDER — ASPIRIN 81 MG PO TBEC: 81.0000 mg | DELAYED_RELEASE_TABLET | Freq: Every day | ORAL | 2 refills | Status: DC

## 2022-02-22 MED ORDER — VITAFOL ULTRA 29-0.6-0.4-200 MG PO CAPS: 1.0000 | ORAL_CAPSULE | Freq: Every day | ORAL | 12 refills | Status: AC

## 2022-02-22 NOTE — Patient Instructions (Addendum)
Second Trimester of Pregnancy  The second trimester of pregnancy is from week 13 through week 27. This is months 4 through 6 of pregnancy. The second trimester is often a time when you feel your best. Your body has adjusted to being pregnant, and you begin to feel better physically. During the second trimester: Morning sickness has lessened or stopped completely. You may have more energy. You may have an increase in appetite. The second trimester is also a time when the unborn baby (fetus) is growing rapidly. At the end of the sixth month, the fetus may be up to 12 inches long and weigh about 1 pounds. You will likely begin to feel the baby move (quickening) between 16 and 20 weeks of pregnancy. Body changes during your second trimester Your body continues to go through many changes during your second trimester. The changes vary and generally return to normal after the baby is born. Physical changes Your weight will continue to increase. You will notice your lower abdomen bulging out. You may begin to get stretch marks on your hips, abdomen, and breasts. Your breasts will continue to grow and to become tender. Dark spots or blotches (chloasma or mask of pregnancy) may develop on your face. A dark line from your belly button to the pubic area (linea nigra) may appear. You may have changes in your hair. These can include thickening of your hair, rapid growth, and changes in texture. Some people also have hair loss during or after pregnancy, or hair that feels dry or thin. Health changes You may develop headaches. You may have heartburn. You may develop constipation. You may develop hemorrhoids or swollen, bulging veins (varicose veins). Your gums may bleed and may be sensitive to brushing and flossing. You may urinate more often because the fetus is pressing on your bladder. You may have back pain. This is caused by: Weight gain. Pregnancy hormones that are relaxing the joints in your  pelvis. A shift in weight and the muscles that support your balance. Follow these instructions at home: Medicines Follow your health care provider's instructions regarding medicine use. Specific medicines may be either safe or unsafe to take during pregnancy. Do not take any medicines unless approved by your health care provider. Take a prenatal vitamin that contains at least 600 micrograms (mcg) of folic acid. Eating and drinking Eat a healthy diet that includes fresh fruits and vegetables, whole grains, good sources of protein such as meat, eggs, or tofu, and low-fat dairy products. Avoid raw meat and unpasteurized juice, milk, and cheese. These carry germs that can harm you and your baby. You may need to take these actions to prevent or treat constipation: Drink enough fluid to keep your urine pale yellow. Eat foods that are high in fiber, such as beans, whole grains, and fresh fruits and vegetables. Limit foods that are high in fat and processed sugars, such as fried or sweet foods. Activity Exercise only as directed by your health care provider. Most people can continue their usual exercise routine during pregnancy. Try to exercise for 30 minutes at least 5 days a week. Stop exercising if you develop contractions in your uterus. Stop exercising if you develop pain or cramping in the lower abdomen or lower back. Avoid exercising if it is very hot or humid or if you are at a high altitude. Avoid heavy lifting. If you choose to, you may have sex unless your health care provider tells you not to. Relieving pain and discomfort Wear a supportive  bra to prevent discomfort from breast tenderness. Take warm sitz baths to soothe any pain or discomfort caused by hemorrhoids. Use hemorrhoid cream if your health care provider approves. Rest with your legs raised (elevated) if you have leg cramps or low back pain. If you develop varicose veins: Wear support hose as told by your health care  provider. Elevate your feet for 15 minutes, 3-4 times a day. Limit salt in your diet. Safety Wear your seat belt at all times when driving or riding in a car. Talk with your health care provider if someone is verbally or physically abusive to you. Lifestyle Do not use hot tubs, steam rooms, or saunas. Do not douche. Do not use tampons or scented sanitary pads. Avoid cat litter boxes and soil used by cats. These carry germs that can cause birth defects in the baby and possibly loss of the fetus by miscarriage or stillbirth. Do not use herbal remedies, alcohol, illegal drugs, or medicines that are not approved by your health care provider. Chemicals in these products can harm your baby. Do not use any products that contain nicotine or tobacco, such as cigarettes, e-cigarettes, and chewing tobacco. If you need help quitting, ask your health care provider. General instructions During a routine prenatal visit, your health care provider will do a physical exam and other tests. He or she will also discuss your overall health. Keep all follow-up visits. This is important. Ask your health care provider for a referral to a local prenatal education class. Ask for help if you have counseling or nutritional needs during pregnancy. Your health care provider can offer advice or refer you to specialists for help with various needs. Where to find more information American Pregnancy Association: americanpregnancy.St[place] and Gynecologists: PoolDevices.com.pt Office on [employer] Health: KeywordPortfolios.com.br Contact a health care provider if you have: A headache that does not go away when you take medicine. Vision changes or you see spots in front of your eyes. Mild pelvic cramps, pelvic pressure, or nagging pain in the abdominal area. Persistent nausea, vomiting, or diarrhea. A bad-smelling vaginal discharge or foul-smelling urine. Pain when you  urinate. Sudden or extreme swelling of your face, hands, ankles, feet, or legs. A fever. Get help right away if you: Have fluid leaking from your vagina. Have spotting or bleeding from your vagina. Have severe abdominal cramping or pain. Have difficulty breathing. Have chest pain. Have fainting spells. Have not felt your baby move for the time period told by your health care provider. Have new or increased pain, swelling, or redness in an arm or leg. Summary The second trimester of pregnancy is from week 13 through week 27 (months 4 through 6). Do not use herbal remedies, alcohol, illegal drugs, or medicines that are not approved by your health care provider. Chemicals in these products can harm your baby. Exercise only as directed by your health care provider. Most people can continue their usual exercise routine during pregnancy. Keep all follow-up visits. This is important. This information is not intended to replace advice given to you by your health care provider. Make sure you discuss any questions you have with your health care provider. Document Revised: 06/27/2019 Document Reviewed: 05/03/2019 Elsevier Patient Education  [place].  Contraception Choices Contraception, also called birth control, refers to methods or devices that prevent pregnancy. Hormonal methods  Contraceptive implant A contraceptive implant is a thin, plastic tube that contains a hormone that prevents pregnancy. It is different from an intrauterine device (  IUD). It is inserted into the upper part of the arm by a health care provider. Implants can be effective for up to 3 years. Progestin-only injections Progestin-only injections are injections of progestin, a synthetic form of the hormone progesterone. They are given every 3 months by a health care provider. Birth control pills Birth control pills are pills that contain hormones that prevent pregnancy. They must be taken once a day, preferably at  the same time each day. A prescription is needed to use this method of contraception. Birth control patch The birth control patch contains hormones that prevent pregnancy. It is placed on the skin and must be changed once a week for three weeks and removed on the fourth week. A prescription is needed to use this method of contraception. Vaginal ring A vaginal ring contains hormones that prevent pregnancy. It is placed in the vagina for three weeks and removed on the fourth week. After that, the process is repeated with a new ring. A prescription is needed to use this method of contraception. Emergency contraceptive Emergency contraceptives prevent pregnancy after unprotected sex. They come in pill form and can be taken up to 5 days after sex. They work best the sooner they are taken after having sex. Most emergency contraceptives are available without a prescription. This method should not be used as your only form of birth control. Barrier methods  Female condom A female condom is a thin sheath that is worn over the penis during sex. Condoms keep sperm from going inside a woman's body. They can be used with a sperm-killing substance (spermicide) to increase their effectiveness. They should be thrown away after one use. Female condom A female condom is a soft, loose-fitting sheath that is put into the vagina before sex. The condom keeps sperm from going inside a woman's body. They should be thrown away after one use. Diaphragm A diaphragm is a soft, dome-shaped barrier. It is inserted into the vagina before sex, along with a spermicide. The diaphragm blocks sperm from entering the uterus, and the spermicide kills sperm. A diaphragm should be left in the vagina for 6-8 hours after sex and removed within 24 hours. A diaphragm is prescribed and fitted by a health care provider. A diaphragm should be replaced every 1-2 years, after giving birth, after gaining more than 15 lb (6.8 kg), and after pelvic  surgery. Cervical cap A cervical cap is a round, soft latex or plastic cup that fits over the cervix. It is inserted into the vagina before sex, along with spermicide. It blocks sperm from entering the uterus. The cap should be left in place for 6-8 hours after sex and removed within 48 hours. A cervical cap must be prescribed and fitted by a health care provider. It should be replaced every 2 years. Sponge A sponge is a soft, circular piece of polyurethane foam with spermicide in it. The sponge helps block sperm from entering the uterus, and the spermicide kills sperm. To use it, you make it wet and then insert it into the vagina. It should be inserted before sex, left in for at least 6 hours after sex, and removed and thrown away within 30 hours. Spermicides Spermicides are chemicals that kill or block sperm from entering the cervix and uterus. They can come as a cream, jelly, suppository, foam, or tablet. A spermicide should be inserted into the vagina with an applicator at least 70-01 minutes before sex to allow time for it to work. The process must be  repeated every time you have sex. Spermicides do not require a prescription. Intrauterine contraception Intrauterine device (IUD) An IUD is a T-shaped device that is put in a woman's uterus. There are two types: Hormone IUD.This type contains progestin, a synthetic form of the hormone progesterone. This type can stay in place for 3-5 years. Copper IUD.This type is wrapped in copper wire. It can stay in place for 10 years. Permanent methods of contraception Female tubal ligation In this method, a woman's fallopian tubes are sealed, tied, or blocked during surgery to prevent eggs from traveling to the uterus. Hysteroscopic sterilization In this method, a small, flexible insert is placed into each fallopian tube. The inserts cause scar tissue to form in the fallopian tubes and block them, so sperm cannot reach an egg. The procedure takes about 3  months to be effective. Another form of birth control must be used during those 3 months. Female sterilization This is a procedure to tie off the tubes that carry sperm (vasectomy). After the procedure, the man can still ejaculate fluid (semen). Another form of birth control must be used for 3 months after the procedure. Natural planning methods Natural family planning In this method, a couple does not have sex on days when the woman could become pregnant. Calendar method In this method, the woman keeps track of the length of each menstrual cycle, identifies the days when pregnancy can happen, and does not have sex on those days. Ovulation method In this method, a couple avoids sex during ovulation. Symptothermal method This method involves not having sex during ovulation. The woman typically checks for ovulation by watching changes in her temperature and in the consistency of cervical mucus. Post-ovulation method In this method, a couple waits to have sex until after ovulation. Where to find more information Centers for Disease Control and Prevention: http://www.wolf.info/ Summary Contraception, also called birth control, refers to methods or devices that prevent pregnancy. Hormonal methods of contraception include implants, injections, pills, patches, vaginal rings, and emergency contraceptives. Barrier methods of contraception can include female condoms, female condoms, diaphragms, cervical caps, sponges, and spermicides. There are two types of IUDs (intrauterine devices). An IUD can be put in a woman's uterus to prevent pregnancy for 3-5 years. Permanent sterilization can be done through a procedure for males and females. Natural family planning methods involve nothaving sex on days when the woman could become pregnant. This information is not intended to replace advice given to you by your health care provider. Make sure you discuss any questions you have with your health care provider. Document  Revised: 06/25/2019 Document Reviewed: 06/25/2019 Elsevier Patient Education  [place]? Guide for patients at [facility]) Why consider waterbirth? Gentle birth for babies  Less pain medicine used in labor  May allow for passive descent/less pushing  May reduce perineal tears  More mobility and instinctive maternal position changes  Increased maternal relaxation   Is waterbirth safe? What are the risks of infection, drowning or other complications? Infection:  Very low risk (3.7 % for tub vs 4.8% for bed)  7 in 8000 waterbirths with documented infection  Poorly cleaned equipment most common cause  Slightly lower group B strep transmission rate  Drowning  Maternal:  Very low risk  Related to seizures or fainting  Newborn:  Very low risk. No evidence of increased risk of respiratory problems in multiple large studies  Physiological protection from breathing under water  Avoid underwater birth if there  are any fetal complications  Once baby's head is out of the water, keep it out.  Birth complication  Some reports of cord trauma, but risk decreased by bringing baby to surface gradually  No evidence of increased risk of shoulder dystocia. Mothers can usually change positions faster in water than in a bed, possibly aiding the maneuvers to free the shoulder.   There are 2 things you MUST do to have a waterbirth with [facility]: Attend a waterbirth class at [place] at [facility]   3rd Wednesday of every month from 7-9 pm (virtual during [place]) [employer] at [email or web address] or [email or web address] or by calling [phone] Bring [place] the certificate from the class to your prenatal appointment or send via [place] with a midwife at 36 weeks* to see if you can still plan a waterbirth and to sign the consent.   *We also recommend that you schedule as many of your prenatal visits with a  midwife as possible.    Helpful information: You may want to bring a bathing suit top to the hospital to wear during labor but this is optional.  All other supplies are provided by the hospital. Please arrive at the hospital with signs of active labor, and do not wait at home until late in labor. It takes 45 min- 1 hour for fetal monitoring, and check in to your room to take place, plus transport and filling of the waterbirth tub.    Things that would prevent you from having a waterbirth: Premature, <37wks  Previous cesarean birth  Presence of thick meconium-stained fluid  Multiple gestation (Twins, triplets, etc.)  Uncontrolled diabetes or gestational diabetes requiring medication  Hypertension diagnosed in pregnancy or preexisting hypertension (gestational hypertension, preeclampsia, or chronic hypertension) Fetal growth restriction (your baby measures less than 10th percentile on ultrasound) Heavy vaginal bleeding  Non-reassuring fetal heart rate  Active infection (MRSA, etc.). Group B Strep is NOT a contraindication for waterbirth.  If your labor has to be induced and induction method requires continuous monitoring of the baby's heart rate  Other risks/issues identified by your obstetrical provider   Please remember that birth is unpredictable. Under certain unforeseeable circumstances your provider may advise against giving birth in the tub. These decisions will be made on a case-by-case basis and with the safety of you and your baby as our highest priority.    Updated 05/06/21

## 2022-02-22 NOTE — Progress Notes (Signed)
  Subjective:    Catherine Paul is a G2P1001 61w2dbeing seen today for her first obstetrical visit.  Her obstetrical history is significant for pregnancy induced hypertension and maternal obesity. Patient does intend to breast feed. Pregnancy history fully reviewed.  Patient reports backache.  Vitals:   02/22/22 0910  BP: 125/83  Pulse: (!) 102  Weight: 184 lb (83.5 kg)    HISTORY: OB History  Gravida Para Term Preterm AB Living  '2 1 1 '$ 0 0 1  SAB IAB Ectopic Multiple Live Births  0 0 0 0 1    # Outcome Date GA Lbr Len/2nd Weight Sex Delivery Anes PTL Lv  2 Current           1 Term 03/13/20 38w5d3:26 / 00:31 6 lb 15.5 oz (3.16 kg) M Vag-Vacuum EPI  LIV     Birth Comments: wnl   History reviewed. No pertinent past medical history. Past Surgical History:  Procedure Laterality Date   MASS EXCISION Left 09/24/2021   Procedure: EXCISION OF LEFT MASS/ACCESSORY BREAST TISSUE;  Surgeon: DiWallace GoingDO;  [facility];  Laterality: Left;  requesting 30 mins   Family History  Problem Relation Age of Onset   Lumbar disc disease Paternal Aunt    Diabetes Maternal Grandmother    Hypertension Maternal Grandmother    Cancer Maternal Grandfather    Colon cancer Maternal Grandfather    Hypertension Maternal Grandfather    Stroke Maternal Grandfather    Hypertension Paternal Grandmother    Hypertension Paternal Grandfather      Exam    Pelvic Exam: Patient declined pelvic exam  System: Breast:  normal appearance, no masses or tenderness   Skin: normal coloration and turgor, no rashes    Neurologic: oriented, no focal deficits   Extremities: normal strength, tone, and muscle mass   HEENT extra ocular movement intact   Mouth/Teeth mucous membranes moist, pharynx normal without lesions and dental hygiene good   Neck supple and no masses   Cardiovascular: regular rate and rhythm   Respiratory:  appears well, vitals  normal, no respiratory distress, acyanotic, normal RR, chest clear, no wheezing, crepitations, rhonchi, normal symmetric air entry   Abdomen: soft, non-tender; bowel sounds normal; no masses,  no organomegaly   Urinary:       Assessment:    Pregnancy: G2P1001 Patient Active Problem List   Diagnosis Date Noted   History of gestational hypertension 02/22/2022   Supervision of other normal pregnancy, antepartum 02/09/2022   Mass of axilla, left 08/21/2021   Axillary mass, left 07/30/2021   Gestational hypertension 03/13/2020   Vacuum-assisted vaginal delivery 03/13/2020   Sciatica without back pain 11/15/2019        Plan:     Initial labs drawn. Prenatal vitamins. Problem list reviewed and updated. Genetic Screening discussed : panorama ordered.  Ultrasound discussed; fetal survey: ordered. Rx ASA provided Patient opted to defer pap smear postpartum Discussed back strengthening exercises  Follow up in 4 weeks. 50% of 30 min visit spent on counseling and coordination of care.     Zoie Sarin 02/22/2022

## 2022-02-22 NOTE — Progress Notes (Signed)
Pt would like to discuss hip/joint pain. Pt has been having pain in hips/sciatic. Pt states family hx of Degenerative disc disease.   Pt states she had IOL at 37 weeks for PIH last pregnancy.

## 2022-02-23 LAB — CERVICOVAGINAL ANCILLARY ONLY
Chlamydia: NEGATIVE
Comment: NEGATIVE
Comment: NEGATIVE
Comment: NORMAL
Neisseria Gonorrhea: NEGATIVE
Trichomonas: NEGATIVE

## 2022-02-23 LAB — PROTEIN / CREATININE RATIO, URINE
Creatinine, Urine: 137.5 mg/dL
Protein, Ur: 16.2 mg/dL
Protein/Creat Ratio: 118 mg/g creat (ref 0–200)

## 2022-02-23 NOTE — BH Specialist Note (Signed)
Integrated Behavioral Health Initial In-Person Visit  MRN: [id number] Name: AYANI OSPINA  Number of [place] Clinician visits: 1 Session Start time:   9:45am Session End time: 10:00am Total time in minutes: 15 mins in person at [facility]   Types of Service: [place] (BHI)  Interpretor:No. Interpretor Name and Language: none   Warm Hand Off Completed.        Subjective: MAREE AINLEY is a 27 y.o. female accompanied by n/a Patient was referred by Dr. Elly Modena for new ob intro. Patient reports the following symptoms/concerns: no reported concerns Duration of problem: n/a; Severity of problem: n/a  Objective: Mood: Good and Affect: Appropriate Risk of harm to self or others: No plan to harm self or others  Life Context: Family and Social: Lives with family in [place]  School/Work: n/a Self-Care: n/a Life Changes: new pregnancy  Patient and/or Family's Strengths/Protective Factors: Concrete supports in place (healthy food, safe environments, etc.)  Goals Addressed: Patient will: Keep medical and prenatal appts  Take prenatal vitamins   Reduce and/or reduce stress  Progress towards Goals: Ongoing  Interventions: Interventions utilized: Preventative Services/Health Promotion  Standardized Assessments completed: n/a  Patient and/or Family Response: Ms. Stepanian reports no concerns during current visit. Ms. Mcdermott reports adequate support at home and safe living environment. Ms Lain-Bennett is set up with mychart and express full understanding how to navigate for appts and messaging.   Assessment: Patient currently experiencing n/a.   Patient may benefit from ob care and integrated behavioral health when needed.  Plan: Follow up with behavioral health clinician on : prn Behavioral recommendations: No recommendations.  Referral(s): n/a "From scale of 1-10, how likely are you to follow  plan?":    Lynnea Ferrier, LCSW

## 2022-02-24 LAB — CULTURE, OB URINE

## 2022-02-24 LAB — URINE CULTURE, OB REFLEX

## 2022-02-26 ENCOUNTER — Telehealth: Payer: Self-pay

## 2022-02-26 NOTE — Telephone Encounter (Signed)
Returned call, pt states that she had light bleeding this morning after she urinated. Advised pt of signs/symptoms of miscarriage and to continue to monitor and report to mau if they occur, pt agreed.

## 2022-02-27 LAB — PANORAMA PRENATAL TEST FULL PANEL:PANORAMA TEST PLUS 5 ADDITIONAL MICRODELETIONS: FETAL FRACTION: 10

## 2022-03-22 ENCOUNTER — Ambulatory Visit (INDEPENDENT_AMBULATORY_CARE_PROVIDER_SITE_OTHER): Payer: BC Managed Care – PPO | Admitting: Advanced Practice Midwife

## 2022-03-22 ENCOUNTER — Encounter: Payer: Self-pay | Admitting: Advanced Practice Midwife

## 2022-03-22 VITALS — BP 119/75 | HR 83 | Wt 182.6 lb

## 2022-03-22 DIAGNOSIS — Z3480 Encounter for supervision of other normal pregnancy, unspecified trimester: Secondary | ICD-10-CM

## 2022-03-22 DIAGNOSIS — R102 Pelvic and perineal pain: Secondary | ICD-10-CM

## 2022-03-22 DIAGNOSIS — O26892 Other specified pregnancy related conditions, second trimester: Secondary | ICD-10-CM

## 2022-03-22 DIAGNOSIS — Z3A16 16 weeks gestation of pregnancy: Secondary | ICD-10-CM

## 2022-03-22 DIAGNOSIS — Z8759 Personal history of other complications of pregnancy, childbirth and the puerperium: Secondary | ICD-10-CM

## 2022-03-22 DIAGNOSIS — M5430 Sciatica, unspecified side: Secondary | ICD-10-CM

## 2022-03-22 NOTE — Progress Notes (Signed)
Pt presents for ROB visit. Declines AFP testing.

## 2022-03-22 NOTE — Progress Notes (Signed)
   PRENATAL VISIT NOTE  Subjective:  Catherine Paul is a 27 y.o. G2P1001 at 46w2dbeing seen today for ongoing prenatal care.  She is currently monitored for the following issues for this low-risk pregnancy and has Sciatica without back pain; Gestational hypertension; Vacuum-assisted vaginal delivery; Mass of axilla, left; Supervision of other normal pregnancy, antepartum; Axillary mass, left; and History of gestational hypertension on their problem list.  Patient reports  hip pain, pelvic pain when standing for long periods .  Contractions: Not present. Vag. Bleeding: None.  Movement: Present. Denies leaking of fluid.   The following portions of the patient's history were reviewed and updated as appropriate: allergies, current medications, past family history, past medical history, past social history, past surgical history and problem list.   Objective:   Vitals:   03/22/22 0826  BP: 119/75  Pulse: 83  Weight: 182 lb 9.6 oz (82.8 kg)    Fetal Status: Fetal Heart Rate (bpm): 154   Movement: Present     General:  Alert, oriented and cooperative. Patient is in no acute distress.  Skin: Skin is warm and dry. No rash noted.   Cardiovascular: Normal heart rate noted  Respiratory: Normal respiratory effort, no problems with respiration noted  Abdomen: Soft, gravid, appropriate for gestational age.  Pain/Pressure: Present     Pelvic: Cervical exam deferred        Extremities: Normal range of motion.  Edema: None  Mental Status: Normal mood and affect. Normal behavior. Normal judgment and thought content.   Assessment and Plan:  Pregnancy: G2P1001 at 142w2d. Supervision of other normal pregnancy, antepartum --Anticipatory guidance about next visits/weeks of pregnancy given.  - Pt is interested in waterbirth.  No contraindications at this time per chart review/patient assessment.   - Pt to enroll in class, see CNMs for most visits in the office.  - Discussed waterbirth as option  for low-risk pregnancy.  Reviewed conditions that may arise during pregnancy that will risk pt out of waterbirth including hypertension, diabetes, fetal growth restriction <10%ile, etc.   2. History of gestational hypertension   3. Pelvic pain affecting pregnancy in second trimester, antepartum --Pt standing for restaurant job --Rest/ice/heat/warm bath/increase PO fluids/Tylenol/pregnancy support belt  - AMB referral to rehabilitation  4. Sciatica without back pain, unspecified laterality  - AMB referral to rehabilitation  5. [redacted] weeks gestation of pregnancy    Preterm labor symptoms and general obstetric precautions including but not limited to vaginal bleeding, contractions, leaking of fluid and fetal movement were reviewed in detail with the patient. Please refer to After Visit Summary for other counseling recommendations.   Return in about 4 weeks (around 04/19/2022) for Midwife preferred.  Future Appointments  Date Time Provider [place]3/12/2022  8:45 AM [facility] NURSE [facility]  9:00 AM [facility] US[facility]/18/2024  9:35 AM Constant, PeVickii ChafeMD [facility]WD[facility]    LiFatima BlankCNM

## 2022-03-22 NOTE — Patient Instructions (Signed)
  Considering Waterbirth? Guide for patients at [facility] [employer] [facility] [place]) Why consider waterbirth? Gentle birth for babies  Less pain medicine used in labor  May allow for passive descent/less pushing  May reduce perineal tears  More mobility and instinctive maternal position changes  Increased maternal relaxation   Is waterbirth safe? What are the risks of infection, drowning or other complications? Infection:  Very low risk (3.7 % for tub vs 4.8% for bed)  7 in 8000 waterbirths with documented infection  Poorly cleaned equipment most common cause  Slightly lower group B strep transmission rate  Drowning  Maternal:  Very low risk  Related to seizures or fainting  Newborn:  Very low risk. No evidence of increased risk of respiratory problems in multiple large studies  Physiological protection from breathing under water  Avoid underwater birth if there are any fetal complications  Once baby's head is out of the water, keep it out.  Birth complication  Some reports of cord trauma, but risk decreased by bringing baby to surface gradually  No evidence of increased risk of shoulder dystocia. Mothers can usually change positions faster in water than in a bed, possibly aiding the maneuvers to free the shoulder.   There are 2 things you MUST do to have a waterbirth with [facility]: Attend a waterbirth class at [place] at [facility]   3rd Wednesday of every month from 7-9 pm (virtual during [place]) [employer] at [email or web address] or by calling [phone] Bring [place] the certificate from the class to your prenatal appointment or send via [place] with a midwife at 36 weeks* to see if you can still plan a waterbirth and to sign the consent.   *We also recommend that you schedule as many of your prenatal visits with a midwife as possible.    Helpful information: You may want to bring a bathing suit top to the hospital  to wear during labor but this is optional.  All other supplies are provided by the hospital. Please arrive at the hospital with signs of active labor, and do not wait at home until late in labor. It takes 45 min- 1 hour for fetal monitoring, and check in to your room to take place, plus transport and filling of the waterbirth tub.    Things that would prevent you from having a waterbirth: Premature, <37wks  Previous cesarean birth  Presence of thick meconium-stained fluid  Multiple gestation (Twins, triplets, etc.)  Uncontrolled diabetes or gestational diabetes requiring medication  Hypertension diagnosed in pregnancy or preexisting hypertension (gestational hypertension, preeclampsia, or chronic hypertension) Fetal growth restriction (your baby measures less than 10th percentile on ultrasound) Heavy vaginal bleeding  Non-reassuring fetal heart rate  Active infection (MRSA, etc.). Group B Strep is NOT a contraindication for waterbirth.  If your labor has to be induced and induction method requires continuous monitoring of the baby's heart rate  Other risks/issues identified by your obstetrical provider   Please remember that birth is unpredictable. Under certain unforeseeable circumstances your provider may advise against giving birth in the tub. These decisions will be made on a case-by-case basis and with the safety of you and your baby as our highest priority.    Updated 05/06/21

## 2022-03-26 ENCOUNTER — Encounter: Payer: Self-pay | Admitting: Advanced Practice Midwife

## 2022-03-29 ENCOUNTER — Other Ambulatory Visit: Payer: Self-pay

## 2022-03-29 DIAGNOSIS — Z3480 Encounter for supervision of other normal pregnancy, unspecified trimester: Secondary | ICD-10-CM

## 2022-03-31 ENCOUNTER — Ambulatory Visit: Payer: BC Managed Care – PPO | Attending: Advanced Practice Midwife | Admitting: Physical Therapy

## 2022-03-31 ENCOUNTER — Encounter: Payer: Self-pay | Admitting: Physical Therapy

## 2022-03-31 ENCOUNTER — Other Ambulatory Visit: Payer: Self-pay

## 2022-03-31 DIAGNOSIS — M62838 Other muscle spasm: Secondary | ICD-10-CM | POA: Insufficient documentation

## 2022-03-31 DIAGNOSIS — M6281 Muscle weakness (generalized): Secondary | ICD-10-CM | POA: Insufficient documentation

## 2022-03-31 DIAGNOSIS — R293 Abnormal posture: Secondary | ICD-10-CM | POA: Insufficient documentation

## 2022-03-31 NOTE — Therapy (Signed)
OUTPATIENT PHYSICAL THERAPY FEMALE PELVIC EVALUATION   Patient Name: Catherine Paul MRN: [id number] DOB:[birth year], 27 y.o., female Today's Date: 03/31/2022  END OF SESSION:  PT End of Session - 03/31/22 0811     Visit Number 1    Date for PT Re-Evaluation 06/23/22    Authorization Type BCBS    PT Start Time 0758    PT Stop Time 0840    PT Time Calculation (min) 42 min             History reviewed. No pertinent past medical history. Past Surgical History:  Procedure Laterality Date   MASS EXCISION Left 09/24/2021   Procedure: EXCISION OF LEFT MASS/ACCESSORY BREAST TISSUE;  Surgeon: Wallace Going, DO;  Location: [place];  Service: Plastics;  Laterality: Left;  requesting 30 mins   Patient Active Problem List   Diagnosis Date Noted   History of gestational hypertension 02/22/2022   Supervision of other normal pregnancy, antepartum 02/09/2022   Mass of axilla, left 08/21/2021   Axillary mass, left 07/30/2021   Gestational hypertension 03/13/2020   Vacuum-assisted vaginal delivery 03/13/2020   Sciatica without back pain 11/15/2019    PCP: General  REFERRING PROVIDER: Elvera Maria, CNM   REFERRING DIAG:  [phone] (ICD-10-CM) - Pelvic pain affecting pregnancy in second trimester, antepartum  M54.30 (ICD-10-CM) - Sciatica without back pain, unspecified laterality    THERAPY DIAG:  Abnormal posture  Muscle weakness (generalized)  Other muscle spasm  Rationale for Evaluation and Treatment: Rehabilitation  ONSET DATE: 11 weeks into pregnancy  SUBJECTIVE:                                                                                                                                                                                           SUBJECTIVE STATEMENT: With the last pregnancy and this one I have SI pain and gets worse throughout the week.  Currently 17 weeks and it got worse the last pregnancy when I had the  same pain Fluid intake: Yes: as much water as I can    PAIN:  Are you having pain? Yes (currently just tension) NPRS scale: 5/10 when it gets worse Pain location: Posterior  Pain type: dull, throbbing, and tight - more sharp when it is bad Pain description: intermittent   Aggravating factors: standing and working, moving my leg to the side sometimes get a twinge Relieving factors: squatting helps, Tylenol  PRECAUTIONS: Other: pregnancy  WEIGHT BEARING RESTRICTIONS: No  FALLS:  Has patient fallen in last 6 months? 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  PLOF: Independent  PATIENT GOALS: be able to have more mobility and be able to walk without pain or without aggravating the back  PERTINENT HISTORY:  Vaginal delivery with 2nd deg tear; pregnant Sexual abuse: No  BOWEL MOVEMENT: Pain with bowel movement: No Type of bowel movement:Type (Bristol Stool Scale) normal, Frequency normal, and Strain No   URINATION: Pain with urination: No Fully empty bladder: No Stream: Strong Urgency: No Frequency: normal Leakage: Sneezing (not much) Pads: No  INTERCOURSE: Pain with intercourse:  not that I can remember Ability to have vaginal penetration:  Yes:     PREGNANCY: Vaginal deliveries 1 Tearing Yes: 2nd deg   PROLAPSE: None   OBJECTIVE:   DIAGNOSTIC FINDINGS:    PATIENT SURVEYS:    PFIQ-7   COGNITION: Overall cognitive status: Within functional limits for tasks assessed     SENSATION: Light touch: Appears intact Proprioception: Appears intact  MUSCLE LENGTH: Hamstrings: Right 80 deg; Left 70 deg Thomas test:   LUMBAR SPECIAL TESTS:    FUNCTIONAL TESTS:  Single leg stand - Rt trendelenburg  GAIT:  Comments: WFL   POSTURE: anterior pelvic tilt  PELVIC ALIGNMENT:  LUMBARAROM/PROM:  A/PROM A/PROM  eval  Flexion 50%   Extension   Right lateral flexion   Left lateral flexion   Right rotation   Left rotation    (Blank rows = not tested)  LOWER EXTREMITY ROM:  Passive ROM Right eval Left eval  Hip flexion 80% 80%  Hip extension    Hip abduction    Hip adduction    Hip internal rotation [facility]  WFL   Hip external rotation [facility]   Knee flexion    Knee extension    Ankle dorsiflexion    Ankle plantarflexion    Ankle inversion    Ankle eversion     (Blank rows = not tested)  LOWER EXTREMITY MMT:  MMT Right eval Left eval  Hip flexion 4/5 5  Hip extension    Hip abduction 4/5 5  Hip adduction 4/5 5  Hip internal rotation 5 5  Hip external rotation 5 5  Knee flexion    Knee extension    Ankle dorsiflexion    Ankle plantarflexion    Ankle inversion    Ankle eversion     PALPATION:   General  adductors, hamstrings, lumbar paraspinals tight                External Perineal Exam normal appearance slightly increased redness of the vulva                             Internal Pelvic Floor hard to relax after contracting  Patient confirms identification and approves PT to assess internal pelvic floor and treatment Yes  PELVIC MMT:   MMT eval  Vaginal 4/5 x 5 reps, held 1 for 17 seconds  Internal Anal Sphincter   External Anal Sphincter   Puborectalis   Diastasis Recti   (Blank rows = not tested)        TONE: Normal   PROLAPSE: no  TODAY'S TREATMENT:  DATE: 03/31/22  EVAL and initial HEP educated and perform as seen below   PATIENT EDUCATION:  Education details: Access Code: [phone] Person educated: Patient Education method: Explanation, Demonstration, Tactile cues, Verbal cues, and Handouts Education comprehension: verbalized understanding and returned demonstration  HOME EXERCISE PROGRAM: Access Code: [email or web address] URL:  [email or web address] Date: 03/31/2022 Prepared by: Jari Favre  Exercises - Quadruped Circle Weight Shifts  - 1 x daily - 7 x weekly - 3 sets - 10 reps - Cat Cow  - 1 x daily - 7 x weekly - 3 sets - 10 reps - Standing Hamstring Stretch with Step  - 1 x daily - 7 x weekly - 1 sets - 3 reps - 30 sec hold - Standing Hamstring Stretch with Step  - 1 x daily - 7 x weekly - 1 sets - 3 reps - 30 sec hold - Standing Hip Flexor Stretch  - 3 x daily - 7 x weekly - 1 sets - 2 reps - 30 sec hold - Standing 'L' Stretch at Counter  - 1 x daily - 7 x weekly - 3 sets - 10 reps  ASSESSMENT:  CLINICAL IMPRESSION: Patient is a 27 y.o. female who was seen today for physical therapy evaluation and treatment for pelvic pain during pregnancy. Pt has a lot of tension in LE and lumbar paraspinals.  She is standing and walking at work, working full time at [employer].  Pt has 4/5 MMT of the pelvic floor and endurance of 17 seconds.  However, after engaging those muscles they did not relax to normal resting tone.  Pt has difficulty coordinating kegel with breathing and was using more shallow breathing and holding breath to perform kegel.  Pt will benefit from skilled PT to work on improved core activation and lengthening of more shortened muscles. Pt will benefit from PT to address all above mentioned impairments for improved healthy pregnancy.  OBJECTIVE IMPAIRMENTS: decreased activity tolerance, decreased coordination, decreased endurance, decreased ROM, decreased strength, increased muscle spasms, impaired flexibility, impaired tone, postural dysfunction, and pain.   ACTIVITY LIMITATIONS: standing and continence  PARTICIPATION LIMITATIONS: community activity and occupation  PERSONAL FACTORS: Time since onset of injury/illness/exacerbation and 1-2 comorbidities: currently [redacted] weeks pregnant, history of 2nd deg tear  are also affecting patient's functional outcome.   REHAB POTENTIAL:  Excellent  CLINICAL DECISION MAKING: Evolving/moderate complexity  EVALUATION COMPLEXITY: Moderate   GOALS: Goals reviewed with patient? Yes  SHORT TERM GOALS: Target date: 04/28/22  Ind with initial HEP Baseline: Goal status: INITIAL  2.  Pt Reports 20% less pain at work Baseline:  Goal status: INITIAL  LONG TERM GOALS: Target date: 06/23/22  Pt will be independent with advanced HEP to maintain improvements made throughout therapy  Baseline:  Goal status: INITIAL  2.  Pt will be able to functional actions such as sneezing without leakage  Baseline:  Goal status: INITIAL  3.  Pt will report 80% reduction of pain due to improvements in posture, strength, and muscle length  Baseline:  Goal status: INITIAL  4.  Pt will not need to take Tylenol due to ability to manage pain with strengthening and stretches Baseline:  Goal status: INITIAL    PLAN:  PT FREQUENCY: 1x/week  PT DURATION: 12 weeks  PLANNED INTERVENTIONS: Therapeutic exercises, Therapeutic activity, Neuromuscular re-education, Balance training, Gait training, Patient/Family education, Self Care, Joint mobilization, Aquatic Therapy, Electrical stimulation, Cryotherapy, Moist heat, Taping, Biofeedback, Manual therapy, and Re-evaluation  PLAN FOR NEXT SESSION: stretches for improved gluteal and  hasmtring, lumbar mobility, maybe STM on sacrum and lumbar for improved mobility, basic core activation for improved ability to relax pelvic floor when working   [employer], PT 03/31/2022, 10:40 PM

## 2022-04-12 ENCOUNTER — Ambulatory Visit: Payer: BC Managed Care – PPO

## 2022-04-12 ENCOUNTER — Ambulatory Visit: Payer: BC Managed Care – PPO | Attending: Obstetrics and Gynecology

## 2022-04-12 ENCOUNTER — Other Ambulatory Visit: Payer: Self-pay

## 2022-04-12 VITALS — BP 122/67 | HR 92

## 2022-04-12 DIAGNOSIS — O99212 Obesity complicating pregnancy, second trimester: Secondary | ICD-10-CM | POA: Insufficient documentation

## 2022-04-12 DIAGNOSIS — Z8759 Personal history of other complications of pregnancy, childbirth and the puerperium: Secondary | ICD-10-CM

## 2022-04-12 DIAGNOSIS — O09292 Supervision of pregnancy with other poor reproductive or obstetric history, second trimester: Secondary | ICD-10-CM | POA: Diagnosis not present

## 2022-04-12 DIAGNOSIS — Z3480 Encounter for supervision of other normal pregnancy, unspecified trimester: Secondary | ICD-10-CM

## 2022-04-12 DIAGNOSIS — Z362 Encounter for other antenatal screening follow-up: Secondary | ICD-10-CM

## 2022-04-12 DIAGNOSIS — Z3A19 19 weeks gestation of pregnancy: Secondary | ICD-10-CM | POA: Diagnosis not present

## 2022-04-19 ENCOUNTER — Encounter: Payer: Self-pay | Admitting: Obstetrics and Gynecology

## 2022-04-19 ENCOUNTER — Ambulatory Visit (INDEPENDENT_AMBULATORY_CARE_PROVIDER_SITE_OTHER): Payer: BC Managed Care – PPO | Admitting: Obstetrics and Gynecology

## 2022-04-19 VITALS — BP 115/75 | HR 89 | Wt 189.0 lb

## 2022-04-19 DIAGNOSIS — Z8759 Personal history of other complications of pregnancy, childbirth and the puerperium: Secondary | ICD-10-CM

## 2022-04-19 DIAGNOSIS — Z3480 Encounter for supervision of other normal pregnancy, unspecified trimester: Secondary | ICD-10-CM

## 2022-04-19 DIAGNOSIS — Z3A20 20 weeks gestation of pregnancy: Secondary | ICD-10-CM

## 2022-04-19 DIAGNOSIS — Z3482 Encounter for supervision of other normal pregnancy, second trimester: Secondary | ICD-10-CM

## 2022-04-19 NOTE — Progress Notes (Signed)
ROB 20.2 wks AFP offered and accepted Cont'd "hip" pain, PT to start in April "Growing pains" abdomen, has pregnancy support belt

## 2022-04-19 NOTE — Progress Notes (Signed)
   PRENATAL VISIT NOTE  Subjective:  Catherine Paul is a 27 y.o. G2P1001 at [redacted]w[redacted]d being seen today for ongoing prenatal care.  She is currently monitored for the following issues for this low-risk pregnancy and has Sciatica without back pain; Vacuum-assisted vaginal delivery; Supervision of other normal pregnancy, antepartum; Axillary mass, left; and History of gestational hypertension on their problem list.  Patient reports backache.  Contractions: Not present. Vag. Bleeding: None.  Movement: Present. Denies leaking of fluid.   The following portions of the patient's history were reviewed and updated as appropriate: allergies, current medications, past family history, past medical history, past social history, past surgical history and problem list.   Objective:   Vitals:   04/19/22 0926  BP: 115/75  Pulse: 89  Weight: 189 lb (85.7 kg)    Fetal Status: Fetal Heart Rate (bpm): 155 Fundal Height: 20 cm Movement: Present     General:  Alert, oriented and cooperative. Patient is in no acute distress.  Skin: Skin is warm and dry. No rash noted.   Cardiovascular: Normal heart rate noted  Respiratory: Normal respiratory effort, no problems with respiration noted  Abdomen: Soft, gravid, appropriate for gestational age.  Pain/Pressure: Absent     Pelvic: Cervical exam deferred        Extremities: Normal range of motion.     Mental Status: Normal mood and affect. Normal behavior. Normal judgment and thought content.   Assessment and Plan:  Pregnancy: G2P1001 at [redacted]w[redacted]d 1. Supervision of other normal pregnancy, antepartum Patient is doing well Scheduled for physical therapy AFP today Follow up incomplete anatomy  2. History of gestational hypertension Normotensive Continue ASA  Preterm labor symptoms and general obstetric precautions including but not limited to vaginal bleeding, contractions, leaking of fluid and fetal movement were reviewed in detail with the patient. Please  refer to After Visit Summary for other counseling recommendations.   Return in about 4 weeks (around 05/17/2022) for in person, ROB, Low risk.  Future Appointments  Date Time Provider [place]  05/10/2022  7:15 AM [facility] NURSE [facility]  05/10/2022  7:30 AM [facility] US3 [facility]  05/14/2022  9:30 AM Desenglau, Tommy Rainwater, PT [facility] None  05/17/2022  8:35 AM Leftwich-Kirby, Kathie Dike, CNM [facility] None  05/28/2022  9:30 AM Desenglau, Tommy Rainwater, PT [facility] None  06/04/2022  9:30 AM Desenglau, Tommy Rainwater, PT [facility] None  06/11/2022  9:30 AM Desenglau, Tommy Rainwater, PT [facility] None  06/15/2022  9:30 AM Desenglau, Tommy Rainwater, PT [facility] None  06/25/2022  9:30 AM Desenglau, Tommy Rainwater, PT [facility] None    Mora Bellman, MD

## 2022-04-21 LAB — AFP, SERUM, OPEN SPINA BIFIDA
AFP MoM: 0.98
AFP Value: 50.6 ng/mL
Gest. Age on Collection Date: 20.2 weeks
Maternal Age At EDD: 25.7 yr
OSBR Risk 1 IN: 10000
Test Results:: NEGATIVE
Weight: 189 [lb_av]

## 2022-04-28 ENCOUNTER — Telehealth: Payer: Self-pay

## 2022-04-28 NOTE — Telephone Encounter (Signed)
After hours triage advised that pt called with fever, chills, sore throat, congestion. S/w pt to follow up and she stated that [facility]

## 2022-05-10 ENCOUNTER — Ambulatory Visit: Payer: BC Managed Care – PPO | Admitting: *Deleted

## 2022-05-10 ENCOUNTER — Ambulatory Visit: Payer: BC Managed Care – PPO | Attending: Obstetrics

## 2022-05-10 ENCOUNTER — Other Ambulatory Visit: Payer: Self-pay | Admitting: *Deleted

## 2022-05-10 VITALS — BP 124/57 | HR 78

## 2022-05-10 DIAGNOSIS — Z8759 Personal history of other complications of pregnancy, childbirth and the puerperium: Secondary | ICD-10-CM

## 2022-05-10 DIAGNOSIS — O99212 Obesity complicating pregnancy, second trimester: Secondary | ICD-10-CM

## 2022-05-10 DIAGNOSIS — E669 Obesity, unspecified: Secondary | ICD-10-CM | POA: Diagnosis not present

## 2022-05-10 DIAGNOSIS — O09292 Supervision of pregnancy with other poor reproductive or obstetric history, second trimester: Secondary | ICD-10-CM | POA: Diagnosis not present

## 2022-05-10 DIAGNOSIS — Z3A23 23 weeks gestation of pregnancy: Secondary | ICD-10-CM | POA: Diagnosis not present

## 2022-05-10 DIAGNOSIS — Z362 Encounter for other antenatal screening follow-up: Secondary | ICD-10-CM | POA: Diagnosis present

## 2022-05-13 ENCOUNTER — Telehealth: Payer: Self-pay | Admitting: *Deleted

## 2022-05-13 NOTE — Telephone Encounter (Signed)
TC to follow up on after hours call. Pt reported HA that gets worse with position changes and had not responded to tylenol. Pt now reports HA is gone, "tylenol finally kicked in". Reports good FM. Denies visual changes, dizziness, or RUQ pain. Reports BPs have been WNL. Advised to try tylenol and caffeine if HA returns. Advised to seek care in [facility] if HA returns and above TX does not relieve or for pre E symptoms.Pt verbalized understanding.

## 2022-05-14 ENCOUNTER — Encounter: Payer: Self-pay | Admitting: Physical Therapy

## 2022-05-14 ENCOUNTER — Ambulatory Visit: Payer: BC Managed Care – PPO | Attending: Advanced Practice Midwife | Admitting: Physical Therapy

## 2022-05-14 DIAGNOSIS — M62838 Other muscle spasm: Secondary | ICD-10-CM | POA: Diagnosis present

## 2022-05-14 DIAGNOSIS — M6281 Muscle weakness (generalized): Secondary | ICD-10-CM | POA: Diagnosis present

## 2022-05-14 DIAGNOSIS — R293 Abnormal posture: Secondary | ICD-10-CM | POA: Diagnosis present

## 2022-05-14 NOTE — Therapy (Signed)
OUTPATIENT PHYSICAL THERAPY FEMALE PELVIC EVALUATION   Patient Name: Catherine Paul MRN: [id number] DOB:[birth year], 27 y.o., female Today's Date: 05/14/2022  END OF SESSION:  PT End of Session - 05/14/22 0927     Visit Number 2    Date for PT Re-Evaluation 06/23/22    Authorization Type BCBS    PT Start Time 0932    PT Stop Time 1015    PT Time Calculation (min) 43 min    Activity Tolerance Patient tolerated treatment well    Behavior During Therapy [facility] for tasks assessed/performed              History reviewed. No pertinent past medical history. Past Surgical History:  Procedure Laterality Date   MASS EXCISION Left 09/24/2021   Procedure: EXCISION OF LEFT MASS/ACCESSORY BREAST TISSUE;  Surgeon: Peggye Form, DO;  Location: [facility];  Service: Plastics;  Laterality: Left;  requesting 30 mins   Patient Active Problem List   Diagnosis Date Noted   History of gestational hypertension 02/22/2022   Supervision of other normal pregnancy, antepartum 02/09/2022   Axillary mass, left 07/30/2021   Vacuum-assisted vaginal delivery 03/13/2020   Sciatica without back pain 11/15/2019    PCP: General  REFERRING PROVIDER: Hurshel Party, CNM   REFERRING DIAG:  [phone] (ICD-10-CM) - Pelvic pain affecting pregnancy in second trimester, antepartum  M54.30 (ICD-10-CM) - Sciatica without back pain, unspecified laterality    THERAPY DIAG:  Abnormal posture  Muscle weakness (generalized)  Other muscle spasm  Rationale for Evaluation and Treatment: Rehabilitation  ONSET DATE: 11 weeks into pregnancy  SUBJECTIVE:                                                                                                                                                                                           SUBJECTIVE STATEMENT: With working less hours it seems to be better.  Not sure what to do when it starts hurting.  Currently [redacted] weeks  gestation Fluid intake: Yes: as much water as I can    PAIN:  Are you having pain? Yes (currently just tension) NPRS scale: 5/10 when it gets worse Pain location: Posterior  Pain type: dull, throbbing, and tight - more sharp when it is bad Pain description: intermittent   Aggravating factors: standing and working, moving my leg to the side sometimes get a twinge Relieving factors: squatting helps, Tylenol  PRECAUTIONS: Other: pregnancy  WEIGHT BEARING RESTRICTIONS: No  FALLS:  Has patient fallen in last 6 months? 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  PLOF: Independent  PATIENT GOALS: be able to have more mobility and be able to walk without pain or without aggravating the back  PERTINENT HISTORY:  Vaginal delivery with 2nd deg tear; pregnant Sexual abuse: No  BOWEL MOVEMENT: Pain with bowel movement: No Type of bowel movement:Type (Bristol Stool Scale) normal, Frequency normal, and Strain No   URINATION: Pain with urination: No Fully empty bladder: No Stream: Strong Urgency: No Frequency: normal Leakage: Sneezing (not much) Pads: No  INTERCOURSE: Pain with intercourse:  not that I can remember Ability to have vaginal penetration:  Yes:     PREGNANCY: Vaginal deliveries 1 Tearing Yes: 2nd deg   PROLAPSE: None   OBJECTIVE:   DIAGNOSTIC FINDINGS:    PATIENT SURVEYS:    PFIQ-7   COGNITION: Overall cognitive status: Within functional limits for tasks assessed     SENSATION: Light touch: Appears intact Proprioception: Appears intact  MUSCLE LENGTH: Hamstrings: Right 80 deg; Left 70 deg Thomas test:   LUMBAR SPECIAL TESTS:    FUNCTIONAL TESTS:  Single leg stand - Rt trendelenburg  GAIT:  Comments: WFL   POSTURE: anterior pelvic tilt  PELVIC ALIGNMENT:  LUMBARAROM/PROM:  A/PROM A/PROM  eval  Flexion 50%   Extension   Right lateral flexion   Left lateral flexion   Right rotation   Left rotation    (Blank rows = not tested)  LOWER EXTREMITY ROM:  Passive ROM Right eval Left eval  Hip flexion 80% 80%  Hip extension    Hip abduction    Hip adduction    Hip internal rotation [facility]  WFL   Hip external rotation [facility]   Knee flexion    Knee extension    Ankle dorsiflexion    Ankle plantarflexion    Ankle inversion    Ankle eversion     (Blank rows = not tested)  LOWER EXTREMITY MMT:  MMT Right eval Left eval  Hip flexion 4/5 5  Hip extension    Hip abduction 4/5 5  Hip adduction 4/5 5  Hip internal rotation 5 5  Hip external rotation 5 5  Knee flexion    Knee extension    Ankle dorsiflexion    Ankle plantarflexion    Ankle inversion    Ankle eversion     PALPATION:   General  adductors, hamstrings, lumbar paraspinals tight                External Perineal Exam normal appearance slightly increased redness of the vulva                             Internal Pelvic Floor hard to relax after contracting  Patient confirms identification and approves PT to assess internal pelvic floor and treatment Yes  PELVIC MMT:   MMT eval  Vaginal 4/5 x 5 reps, held 1 for 17 seconds  Internal Anal Sphincter   External Anal Sphincter   Puborectalis   Diastasis Recti   (Blank rows = not tested)        TONE: Normal   PROLAPSE: no  TODAY'S TREATMENT:  DATE: 05/14/22  Manual: STM and TPR to lumbar and bil gluteals - Rt more tight than left  Exericses: Qped cat cow Rotation qped UE and LE reaches in qudaruped - block for feedback to keep back flat Breathing into side ball under side in sidelying Supine - semi reclined knees bent -core transversus abdominus activation, add ball, dead bug Side lying clam   PATIENT EDUCATION:  Education details:  Access Code: [email or web address] Person educated: Patient Education method: Explanation, Demonstration, Tactile cues, Verbal cues, and Handouts Education comprehension: verbalized understanding and returned demonstration  HOME EXERCISE PROGRAM: Access Code: [email or web address] URL: [email or web address] Date: 05/14/2022 Prepared by: Dwana Curd  Exercises - Quadruped Circle Weight Shifts  - 1 x daily - 7 x weekly - 3 sets - 10 reps - Cat Cow  - 1 x daily - 7 x weekly - 3 sets - 10 reps - Standing Hamstring Stretch with Step  - 1 x daily - 7 x weekly - 1 sets - 3 reps - 30 sec hold - Standing Hip Flexor Stretch  - 3 x daily - 7 x weekly - 1 sets - 2 reps - 30 sec hold - Standing 'L' Stretch at Counter  - 1 x daily - 7 x weekly - 3 sets - 10 reps - Thoracic Sidebending on Swiss Ball  - 1 x daily - 7 x weekly - 3 sets - 10 reps - Quadruped Alternating Arm Lift  - 1 x daily - 7 x weekly - 2 sets - 10 reps - Rock  - 1 x daily - 7 x weekly - 3 sets - 10 reps - Clamshell  - 1 x daily - 7 x weekly - 3 sets - 10 reps  ASSESSMENT:  CLINICAL IMPRESSION: Today's session focused on soft tissue release with more tension on Rt side.  Working on improved thoracic mobility and breathing for maintaining improved soft tissue length. Pt was able to work on basic core strength.  Has weakness in left hip more than Rt.  Exercises given to work on progressing strength for improved posture and decreased pain with standing activities.Pt will benefit from PT to address all above mentioned impairments for improved healthy pregnancy.  OBJECTIVE IMPAIRMENTS: decreased activity tolerance, decreased coordination, decreased endurance, decreased ROM, decreased strength, increased muscle spasms, impaired flexibility, impaired tone, postural dysfunction, and pain.   ACTIVITY LIMITATIONS: standing and continence  PARTICIPATION LIMITATIONS: community activity and occupation  PERSONAL FACTORS: Time since onset of  injury/illness/exacerbation and 1-2 comorbidities: currently [redacted] weeks pregnant, history of 2nd deg tear  are also affecting patient's functional outcome.   REHAB POTENTIAL: Excellent  CLINICAL DECISION MAKING: Evolving/moderate complexity  EVALUATION COMPLEXITY: Moderate   GOALS: Goals reviewed with patient? Yes  SHORT TERM GOALS: Target date: 04/28/22 Updated 05/14/22  Ind with initial HEP Baseline: Goal status: MET  2.  Pt Reports 20% less pain at work Baseline:  Goal status: IN PROGRESS  LONG TERM GOALS: Target date: 06/23/22  Pt will be independent with advanced HEP to maintain improvements made throughout therapy  Baseline:  Goal status: INITIAL  2.  Pt will be able to functional actions such as sneezing without leakage  Baseline:  Goal status: INITIAL  3.  Pt will report 80% reduction of pain due to improvements in posture, strength, and muscle length  Baseline:  Goal status: INITIAL  4.  Pt will not need to take Tylenol due to ability to manage pain with strengthening and stretches Baseline:  Goal status: INITIAL  PLAN:  PT FREQUENCY: 1x/week  PT DURATION: 12 weeks  PLANNED INTERVENTIONS: Therapeutic exercises, Therapeutic activity, Neuromuscular re-education, Balance training, Gait training, Patient/Family education, Self Care, Joint mobilization, Aquatic Therapy, Electrical stimulation, Cryotherapy, Moist heat, Taping, Biofeedback, Manual therapy, and Re-evaluation  PLAN FOR NEXT SESSION: thoracic and lumbar mobility, progress core activation for improved posture and ability to relax pelvic floor when working, sitting on pball core, standing hip ex's   Junious Silk, PT 05/14/2022, 10:25 AM

## 2022-05-17 ENCOUNTER — Ambulatory Visit (INDEPENDENT_AMBULATORY_CARE_PROVIDER_SITE_OTHER): Payer: BC Managed Care – PPO | Admitting: Advanced Practice Midwife

## 2022-05-17 ENCOUNTER — Encounter: Payer: Self-pay | Admitting: Advanced Practice Midwife

## 2022-05-17 VITALS — BP 118/78 | HR 98 | Wt 184.0 lb

## 2022-05-17 DIAGNOSIS — Z3480 Encounter for supervision of other normal pregnancy, unspecified trimester: Secondary | ICD-10-CM

## 2022-05-17 DIAGNOSIS — Z3A24 24 weeks gestation of pregnancy: Secondary | ICD-10-CM

## 2022-05-17 DIAGNOSIS — R42 Dizziness and giddiness: Secondary | ICD-10-CM

## 2022-05-17 DIAGNOSIS — M5430 Sciatica, unspecified side: Secondary | ICD-10-CM

## 2022-05-17 DIAGNOSIS — Z8759 Personal history of other complications of pregnancy, childbirth and the puerperium: Secondary | ICD-10-CM

## 2022-05-17 DIAGNOSIS — R102 Pelvic and perineal pain: Secondary | ICD-10-CM

## 2022-05-17 DIAGNOSIS — O26892 Other specified pregnancy related conditions, second trimester: Secondary | ICD-10-CM

## 2022-05-17 NOTE — Progress Notes (Signed)
   PRENATAL VISIT NOTE  Subjective:  Catherine Paul is a 27 y.o. G2P1001 at [redacted]w[redacted]d being seen today for ongoing prenatal care.  She is currently monitored for the following issues for this low-risk pregnancy and has Sciatica without back pain; Vacuum-assisted vaginal delivery; Supervision of other normal pregnancy, antepartum; Axillary mass, left; and History of gestational hypertension on their problem list.  Patient reports  hip/pelvic pain with sciatica .  Contractions: Irritability. Vag. Bleeding: None.  Movement: Present. Denies leaking of fluid.   The following portions of the patient's history were reviewed and updated as appropriate: allergies, current medications, past family history, past medical history, past social history, past surgical history and problem list.   Objective:   Vitals:   05/17/22 0825  BP: 118/78  Pulse: 98  Weight: 184 lb (83.5 kg)    Fetal Status: Fetal Heart Rate (bpm): 154   Movement: Present     General:  Alert, oriented and cooperative. Patient is in no acute distress.  Skin: Skin is warm and dry. No rash noted.   Cardiovascular: Normal heart rate noted  Respiratory: Normal respiratory effort, no problems with respiration noted  Abdomen: Soft, gravid, appropriate for gestational age.  Pain/Pressure: Absent     Pelvic: Cervical exam deferred        Extremities: Normal range of motion.  Edema: None  Mental Status: Normal mood and affect. Normal behavior. Normal judgment and thought content.   Assessment and Plan:  Pregnancy: G2P1001 at [redacted]w[redacted]d 1. History of gestational hypertension --baseline labs wnl --BP wnl, no s/sx of PEC  2. Supervision of other normal pregnancy, antepartum --Anticipatory guidance about next visits/weeks of pregnancy given.   3. Pelvic pain affecting pregnancy in second trimester, antepartum --Pt started PT, has 6 week plan --Reviewed s/sx of PTL, reasons to seek care  4. Sciatica without back pain, unspecified  laterality    Preterm labor symptoms and general obstetric precautions including but not limited to vaginal bleeding, contractions, leaking of fluid and fetal movement were reviewed in detail with the patient. Please refer to After Visit Summary for other counseling recommendations.   Return in about 4 weeks (around 06/14/2022) for GTT at next visit, LOB.  Future Appointments  Date Time Provider Department Center  05/28/2022  9:30 AM Desenglau, Shireen Quan, PT [facility] None  06/04/2022  9:30 AM Desenglau, Shireen Quan, PT [facility] None  06/11/2022  9:30 AM Desenglau, Shireen Quan, PT [facility] None  06/15/2022  9:30 AM Desenglau, Shireen Quan, PT [facility] None  06/25/2022  9:30 AM Desenglau, Shireen Quan, PT [facility] None  07/12/2022  7:45 AM [facility] NURSE [facility]  07/12/2022  8:00 AM [facility] US1 [facility]    Sharen Counter, CNM

## 2022-05-17 NOTE — Progress Notes (Signed)
Pt presents for ROB visit. Pt reports dizziness a few days last week. No other concerns.

## 2022-05-18 LAB — CBC
Hematocrit: 35.4 % (ref 34.0–46.6)
Hemoglobin: 12.1 g/dL (ref 11.1–15.9)
MCH: 30 pg (ref 26.6–33.0)
MCHC: 34.2 g/dL (ref 31.5–35.7)
MCV: 88 fL (ref 79–97)
Platelets: 255 10*3/uL (ref 150–450)
RBC: 4.03 x10E6/uL (ref 3.77–5.28)
RDW: 13.4 % (ref 11.7–15.4)
WBC: 10.7 10*3/uL (ref 3.4–10.8)

## 2022-05-28 ENCOUNTER — Ambulatory Visit: Payer: BC Managed Care – PPO | Admitting: Physical Therapy

## 2022-05-28 DIAGNOSIS — M6281 Muscle weakness (generalized): Secondary | ICD-10-CM

## 2022-05-28 DIAGNOSIS — R293 Abnormal posture: Secondary | ICD-10-CM | POA: Diagnosis not present

## 2022-05-28 DIAGNOSIS — M62838 Other muscle spasm: Secondary | ICD-10-CM

## 2022-05-28 NOTE — Therapy (Signed)
OUTPATIENT PHYSICAL THERAPY FEMALE PELVIC TREATMENT   Patient Name: Catherine Paul MRN: [id number] DOB:1995/08/02, 27 y.o., female Today's Date: 05/28/2022  END OF SESSION:  PT End of Session - 05/28/22 0932     Visit Number 3    Date for PT Re-Evaluation 06/23/22    Authorization Type BCBS    PT Start Time 0932    PT Stop Time 1011    PT Time Calculation (min) 39 min    Activity Tolerance Patient tolerated treatment well    Behavior During Therapy [facility] for tasks assessed/performed               No past medical history on file. Past Surgical History:  Procedure Laterality Date   MASS EXCISION Left 09/24/2021   Procedure: EXCISION OF LEFT MASS/ACCESSORY BREAST TISSUE;  Surgeon: Peggye Form, DO;  Location: [facility];  Service: Plastics;  Laterality: Left;  requesting 30 mins   Patient Active Problem List   Diagnosis Date Noted   History of gestational hypertension 02/22/2022   Supervision of other normal pregnancy, antepartum 02/09/2022   Axillary mass, left 07/30/2021   Vacuum-assisted vaginal delivery 03/13/2020   Sciatica without back pain 11/15/2019    PCP: General  REFERRING PROVIDER: Hurshel Party, CNM   REFERRING DIAG:  [phone] (ICD-10-CM) - Pelvic pain affecting pregnancy in second trimester, antepartum  M54.30 (ICD-10-CM) - Sciatica without back pain, unspecified laterality    THERAPY DIAG:  Abnormal posture  Muscle weakness (generalized)  Other muscle spasm  Rationale for Evaluation and Treatment: Rehabilitation  ONSET DATE: 11 weeks into pregnancy  SUBJECTIVE:                                                                                                                                                                                           SUBJECTIVE STATEMENT: Doing okay. Rt side was sore after painting.  Currently 26-[redacted] weeks gestation Fluid intake: Yes: as much water as I can    PAIN:   Are you having pain? No (it is usually after a lot of activity)   PRECAUTIONS: Other: pregnancy  WEIGHT BEARING RESTRICTIONS: No  FALLS:  Has patient fallen in last 6 months? 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  PATIENT GOALS: be able to have more mobility and be able to walk without pain or without aggravating the back  PERTINENT HISTORY:  Vaginal delivery with 2nd deg tear; pregnant Sexual abuse: No  BOWEL MOVEMENT: Pain with bowel movement:  No Type of bowel movement:Type (Bristol Stool Scale) normal, Frequency normal, and Strain No   URINATION: Pain with urination: No Fully empty bladder: No Stream: Strong Urgency: No Frequency: normal Leakage: Sneezing (not much) Pads: No  INTERCOURSE: Pain with intercourse:  not that I can remember Ability to have vaginal penetration:  Yes:     PREGNANCY: Vaginal deliveries 1 Tearing Yes: 2nd deg   PROLAPSE: None   OBJECTIVE:   DIAGNOSTIC FINDINGS:    PATIENT SURVEYS:    PFIQ-7   COGNITION: Overall cognitive status: Within functional limits for tasks assessed     SENSATION: Light touch: Appears intact Proprioception: Appears intact  MUSCLE LENGTH: Hamstrings: Right 80 deg; Left 70 deg Thomas test:   LUMBAR SPECIAL TESTS:    FUNCTIONAL TESTS:  Single leg stand - Rt trendelenburg  GAIT:  Comments: WFL   POSTURE: anterior pelvic tilt  PELVIC ALIGNMENT:  LUMBARAROM/PROM:  A/PROM A/PROM  eval  Flexion 50%  Extension   Right lateral flexion   Left lateral flexion   Right rotation   Left rotation    (Blank rows = not tested)  LOWER EXTREMITY ROM:  Passive ROM Right eval Left eval  Hip flexion 80% 80%  Hip extension    Hip abduction    Hip adduction    Hip internal rotation [facility]  WFL   Hip external rotation [facility]   Knee flexion    Knee  extension    Ankle dorsiflexion    Ankle plantarflexion    Ankle inversion    Ankle eversion     (Blank rows = not tested)  LOWER EXTREMITY MMT:  MMT Right eval Left eval  Hip flexion 4/5 5  Hip extension    Hip abduction 4/5 5  Hip adduction 4/5 5  Hip internal rotation 5 5  Hip external rotation 5 5  Knee flexion    Knee extension    Ankle dorsiflexion    Ankle plantarflexion    Ankle inversion    Ankle eversion     PALPATION:   General  adductors, hamstrings, lumbar paraspinals tight                External Perineal Exam normal appearance slightly increased redness of the vulva                             Internal Pelvic Floor hard to relax after contracting  Patient confirms identification and approves PT to assess internal pelvic floor and treatment Yes  PELVIC MMT:   MMT eval  Vaginal 4/5 x 5 reps, held 1 for 17 seconds  Internal Anal Sphincter   External Anal Sphincter   Puborectalis   Diastasis Recti   (Blank rows = not tested)        TONE: Normal   PROLAPSE: no  TODAY'S TREATMENT:  DATE: 05/28/22    Exericses: H/s stretch NMRE: cues to exhale and engage core with VC and TC for posture and breathing Sitting on ball - pelvic tilt and circles Sittin gon pball - march and UE lefting Standing hip abduction and flexion - 10x bil Supine - semi reclined knees bent -core transversus abdominus activation Side lying reverse clam Side step - light green loop - 10x bil    PATIENT EDUCATION:  Education details: Access Code: [email or web address] Person educated: Patient Education method: Explanation, Demonstration, Tactile cues, Verbal cues, and Handouts Education comprehension: verbalized understanding and returned demonstration  HOME EXERCISE PROGRAM: Access Code: [email or web address] URL: [email or web address] Date:  05/14/2022 Prepared by: Dwana Curd  Exercises - Quadruped Circle Weight Shifts  - 1 x daily - 7 x weekly - 3 sets - 10 reps - Cat Cow  - 1 x daily - 7 x weekly - 3 sets - 10 reps - Standing Hamstring Stretch with Step  - 1 x daily - 7 x weekly - 1 sets - 3 reps - 30 sec hold - Standing Hip Flexor Stretch  - 3 x daily - 7 x weekly - 1 sets - 2 reps - 30 sec hold - Standing 'L' Stretch at [employer]  - 1 x daily - 7 x weekly - 3 sets - 10 reps - Thoracic Sidebending on Swiss Ball  - 1 x daily - 7 x weekly - 3 sets - 10 reps - Quadruped Alternating Arm Lift  - 1 x daily - 7 x weekly - 2 sets - 10 reps - Rock  - 1 x daily - 7 x weekly - 3 sets - 10 reps - Clamshell  - 1 x daily - 7 x weekly - 3 sets - 10 reps  ASSESSMENT:  CLINICAL IMPRESSION: Today's session focused on core activation and coordination with exhale for improved pressure management without overusing back muscles.  Pt is currently [redacted] weeks gestation.  Pt at 2nd treatment visit today . Pt is still working on more advanced strength for core and hip strength in order to be able to sustain activity level for safe and active pregnancy.  Pt has made progress overall with less pain and less leakage.  Pt will benefit from PT to address all above mentioned impairments for improved healthy pregnancy.  OBJECTIVE IMPAIRMENTS: decreased activity tolerance, decreased coordination, decreased endurance, decreased ROM, decreased strength, increased muscle spasms, impaired flexibility, impaired tone, postural dysfunction, and pain.   ACTIVITY LIMITATIONS: standing and continence  PARTICIPATION LIMITATIONS: community activity and occupation  PERSONAL FACTORS: Time since onset of injury/illness/exacerbation and 1-2 comorbidities: currently [redacted] weeks pregnant, history of 2nd deg tear  are also affecting patient's functional outcome.   REHAB POTENTIAL: Excellent  CLINICAL DECISION MAKING: Evolving/moderate complexity  EVALUATION COMPLEXITY:  Moderate   GOALS: Goals reviewed with patient? Yes  SHORT TERM GOALS: Target date: 04/28/22 Updated 05/28/22   Ind with initial HEP Baseline: Goal status: MET  2.  Pt Reports 20% less pain at work Baseline:  Goal status: MET  LONG TERM GOALS: Target date: 06/23/22 Updated 05/28/22  Pt will be independent with advanced HEP to maintain improvements made throughout therapy  Baseline:  Goal status: IN PROGRESS  2.  Pt will be able to functional actions such as sneezing without leakage  Baseline: couple of Saturdays ago I leaked when sneezing but other than that, it has been better Goal status: IN PROGRESS  3.  Pt will report 80% reduction of pain due to improvements in  posture, strength, and muscle length  Baseline: 60-70% Goal status: IN PROGRESS  4.  Pt will not need to take Tylenol due to ability to manage pain with strengthening and stretches Baseline: have not had to take for pain the last couple weeks, but have to rest when having pain Goal status: IN PROGRESS    PLAN:  PT FREQUENCY: 1x/week  PT DURATION: 12 weeks  PLANNED INTERVENTIONS: Therapeutic exercises, Therapeutic activity, Neuromuscular re-education, Balance training, Gait training, Patient/Family education, Self Care, Joint mobilization, Aquatic Therapy, Electrical stimulation, Cryotherapy, Moist heat, Taping, Biofeedback, Manual therapy, and Re-evaluation  PLAN FOR NEXT SESSION: thoracic and lumbar mobility, progress core activation for improved posture and ability to relax pelvic floor when working, sitting on pball core, progress standing hip ex's, add staggered stance or half kneel   [employer], PT 05/28/2022, 9:33 AM

## 2022-06-02 ENCOUNTER — Encounter (HOSPITAL_COMMUNITY): Payer: Self-pay | Admitting: Obstetrics and Gynecology

## 2022-06-02 ENCOUNTER — Inpatient Hospital Stay (HOSPITAL_COMMUNITY)
Admission: AD | Admit: 2022-06-02 | Discharge: 2022-06-02 | Disposition: A | Discharge status: Home / Self Care | Payer: BC Managed Care – PPO | Attending: Obstetrics and Gynecology | Admitting: Obstetrics and Gynecology

## 2022-06-02 DIAGNOSIS — Z7982 Long term (current) use of aspirin: Secondary | ICD-10-CM | POA: Diagnosis not present

## 2022-06-02 DIAGNOSIS — Z3A26 26 weeks gestation of pregnancy: Secondary | ICD-10-CM | POA: Insufficient documentation

## 2022-06-02 DIAGNOSIS — O4700 False labor before 37 completed weeks of gestation, unspecified trimester: Secondary | ICD-10-CM

## 2022-06-02 DIAGNOSIS — Z3689 Encounter for other specified antenatal screening: Secondary | ICD-10-CM

## 2022-06-02 LAB — URINALYSIS, ROUTINE W REFLEX MICROSCOPIC
Bilirubin Urine: NEGATIVE
Glucose, UA: NEGATIVE mg/dL
Hgb urine dipstick: NEGATIVE
Ketones, ur: NEGATIVE mg/dL
Nitrite: NEGATIVE
Protein, ur: NEGATIVE mg/dL
Specific Gravity, Urine: 1.004 — ABNORMAL LOW (ref 1.005–1.030)
pH: 6 (ref 5.0–8.0)

## 2022-06-02 NOTE — MAU Provider Note (Signed)
History     [id number]  Arrival date and time: 06/02/22 0746    Chief Complaint  Patient presents with   Contractions     HPI DEIJA BUHRMAN is a 27 y.o. at [redacted]w[redacted]d with PMHx notable for gHTN in prior pregnancy, who presents for contractions.   Patient reports that around 0500 had about six contractions in the space of 40 minutes, rates them a 2/10 They were not very painful but noticeable Thought they were BH contractions but given the frequency called the office and was told to come in She reports no contractions since arrival to MAU Endorses good fetal movement Denies vaginal bleeding or discharge No leaking fluid   O/Positive/-- (01/09 1521)  OB History     Gravida  2   Para  1   Term  1   Preterm  0   AB  0   Living  1      SAB  0   IAB  0   Ectopic  0   Multiple  0   Live Births  1           History reviewed. No pertinent past medical history.  Past Surgical History:  Procedure Laterality Date   MASS EXCISION Left 09/24/2021   Procedure: EXCISION OF LEFT MASS/ACCESSORY BREAST TISSUE;  Surgeon: Peggye Form, DO;  Location: [facility];  Service: Plastics;  Laterality: Left;  requesting 30 mins    Family History  Problem Relation Age of Onset   Lumbar disc disease Paternal Aunt    Diabetes Maternal Grandmother    Hypertension Maternal Grandmother    Cancer Maternal Grandfather    Colon cancer Maternal Grandfather    Hypertension Maternal Grandfather    Stroke Maternal Grandfather    Hypertension Paternal Grandmother    Hypertension Paternal Grandfather     Social History   Socioeconomic History   Marital status: Married    Spouse name: Not on file   Number of children: Not on file   Years of education: Not on file   Highest education level: Not on file  Occupational History   Not on file  Tobacco Use   Smoking status: Never   Smokeless tobacco: Never  Vaping Use   Vaping Use: Never used   Substance and Sexual Activity   Alcohol use: Not Currently    Comment: 1-3 week   Drug use: Never   Sexual activity: Yes    Partners: Male    Birth control/protection: None  Other Topics Concern   Not on file  Social History Narrative   Not on file   Social Determinants of Health   Financial Resource Strain: Not on file  Food Insecurity: Not on file  Transportation Needs: Not on file  Physical Activity: Not on file  Stress: Not on file  Social Connections: Not on file  Intimate Partner Violence: Not on file    No Known Allergies  No current facility-administered medications on file prior to encounter.   Current Outpatient Medications on File Prior to Encounter  Medication Sig Dispense Refill   aspirin EC 81 MG tablet Take 1 tablet (81 mg total) by mouth daily. Take after 12 weeks for prevention of preeclampsia later in pregnancy 300 tablet 2   Prenat-Fe Poly-Methfol-FA-DHA (VITAFOL ULTRA) 29-0.6-0.4-200 MG CAPS Take 1 capsule by mouth daily. 30 capsule 12   Prenatal Vit-Fe Fumarate-FA (PRENATAL VITAMINS PO) Take 1 tablet by mouth daily. (Patient not taking: Reported on 04/12/2022)  ROS Pertinent positives and negative per HPI, all others reviewed and negative  Physical Exam   BP 120/72   Pulse 92   Temp 98 F (36.7 C) (Oral)   Resp 19   Ht 5\' 4"  (1.626 m)   Wt 84.1 kg   LMP 11/28/2021   SpO2 99%   BMI 31.81 kg/m   Patient Vitals for the past 24 hrs:  BP Temp Temp src Pulse Resp SpO2 Height Weight  06/02/22 0837 120/72 -- -- 92 -- -- -- --  06/02/22 0809 120/81 98 F (36.7 C) Oral 100 19 -- -- --  06/02/22 0756 126/74 98 F (36.7 C) Oral 98 17 99 % 5\' 4"  (1.626 m) 84.1 kg    Physical Exam Vitals reviewed.  Constitutional:      General: She is not in acute distress.    Appearance: She is well-developed. She is not diaphoretic.  Eyes:     General: No scleral icterus. Pulmonary:     Effort: Pulmonary effort is normal. No respiratory distress.   Abdominal:     General: There is no distension.     Palpations: Abdomen is soft.     Tenderness: There is no abdominal tenderness. There is no guarding or rebound.  Skin:    General: Skin is warm and dry.  Neurological:     Mental Status: She is alert.     Coordination: Coordination normal.      Cervical Exam Dilation: Closed Exam by:: Ecstat MD  Bedside Ultrasound Not performed.  My interpretation: n/a  FHT Baseline: 160 bpm Variability: Good {> 6 bpm) Accelerations: Reactive Decelerations: Absent Uterine activity: None Cat: I  Labs Results for orders placed or performed during the hospital encounter of 06/02/22 (from the past 24 hour(s))  Urinalysis, Routine w reflex microscopic -Urine, Clean Catch     Status: Abnormal   Collection Time: 06/02/22  8:01 AM  Result Value Ref Range   Color, Urine YELLOW YELLOW   APPearance CLOUDY (A) CLEAR   Specific Gravity, Urine 1.004 (L) 1.005 - 1.030   pH 6.0 5.0 - 8.0   Glucose, UA NEGATIVE NEGATIVE mg/dL   Hgb urine dipstick NEGATIVE NEGATIVE   Bilirubin Urine NEGATIVE NEGATIVE   Ketones, ur NEGATIVE NEGATIVE mg/dL   Protein, ur NEGATIVE NEGATIVE mg/dL   Nitrite NEGATIVE NEGATIVE   Leukocytes,Ua LARGE (A) NEGATIVE   RBC / HPF 0-5 0 - 5 RBC/hpf   WBC, UA 21-50 0 - 5 WBC/hpf   Bacteria, UA FEW (A) NONE SEEN   Squamous Epithelial / HPF 11-20 0 - 5 /HPF   Mucus PRESENT     Imaging No results found.  MAU Course  Procedures Lab Orders         Urinalysis, Routine w reflex microscopic -Urine, Clean Catch    No orders of the defined types were placed in this encounter.  Imaging Orders  No imaging studies ordered today    MDM Moderate (Level 3-4)  Assessment and Plan  #Preterm contractions without preterm labor #[redacted] weeks gestation of pregnancy Toco flat, NST reactive, no pain or contractions currently, cervix closed on monitor. Reviewed return precautions.   #FWB FHT Cat I NST: Reactive   Dispo: discharged  to home in stable condition    Venora Maples, MD/MPH 06/02/22 8:40 AM  Allergies as of 06/02/2022   No Known Allergies      Medication List     TAKE these medications    aspirin EC 81 MG tablet Take  1 tablet (81 mg total) by mouth daily. Take after 12 weeks for prevention of preeclampsia later in pregnancy   PRENATAL VITAMINS PO Take 1 tablet by mouth daily.   Vitafol Ultra 29-0.6-0.4-200 MG Caps Take 1 capsule by mouth daily.

## 2022-06-02 NOTE — MAU Note (Addendum)
...  Catherine Paul is a 27 y.o. at [redacted]w[redacted]d here in MAU reporting: Intermittent tightness in her abdomen since 0500 this morning once arriving to work. She reports at times she feels it in her entire abdomen and other times it is just the top of her abdomen. She reports they feel like contractions. She reports from 0500-0540 she experienced 6. She reports they are now every 6 or so minutes apart. Denies VB or LOF. Denies concerning vaginal discharge, vaginal itching, and vaginal odors. Denies recent IC. +FM.  Onset of complaint: 0500 Pain score: 2/10 entire abdomen  FHT: 154 initial external Lab orders placed from triage:  UA

## 2022-06-02 NOTE — MAU Note (Signed)
G2P1 at 26.4 weeks reporting cramping that started this morning.  It is a 2/10, mostly pressure, happens several times in an hour.  Reports good FM, no VB, no LOF.  NO current OB complications.  GHTN with last pregnancy.

## 2022-06-04 ENCOUNTER — Ambulatory Visit: Payer: BC Managed Care – PPO | Admitting: Physical Therapy

## 2022-06-07 ENCOUNTER — Other Ambulatory Visit: Payer: Self-pay

## 2022-06-07 ENCOUNTER — Encounter (HOSPITAL_COMMUNITY): Payer: Self-pay | Admitting: Obstetrics & Gynecology

## 2022-06-07 ENCOUNTER — Inpatient Hospital Stay (HOSPITAL_COMMUNITY)
Admission: AD | Admit: 2022-06-07 | Discharge: 2022-06-08 | Disposition: A | Discharge status: Home / Self Care | Payer: BC Managed Care – PPO | Attending: Obstetrics & Gynecology | Admitting: Obstetrics & Gynecology

## 2022-06-07 ENCOUNTER — Inpatient Hospital Stay (HOSPITAL_COMMUNITY): Payer: BC Managed Care – PPO

## 2022-06-07 DIAGNOSIS — Z3A27 27 weeks gestation of pregnancy: Secondary | ICD-10-CM | POA: Diagnosis not present

## 2022-06-07 DIAGNOSIS — Z3689 Encounter for other specified antenatal screening: Secondary | ICD-10-CM

## 2022-06-07 DIAGNOSIS — O26612 Liver and biliary tract disorders in pregnancy, second trimester: Secondary | ICD-10-CM | POA: Insufficient documentation

## 2022-06-07 DIAGNOSIS — K8080 Other cholelithiasis without obstruction: Secondary | ICD-10-CM | POA: Insufficient documentation

## 2022-06-07 DIAGNOSIS — K819 Cholecystitis, unspecified: Secondary | ICD-10-CM | POA: Diagnosis not present

## 2022-06-07 DIAGNOSIS — R1011 Right upper quadrant pain: Secondary | ICD-10-CM | POA: Diagnosis present

## 2022-06-07 HISTORY — DX: Essential (primary) hypertension: I10

## 2022-06-07 HISTORY — DX: Gestational (pregnancy-induced) hypertension without significant proteinuria, unspecified trimester: O13.9

## 2022-06-07 LAB — CBC WITH DIFFERENTIAL/PLATELET
Abs Immature Granulocytes: 0.08 10*3/uL — ABNORMAL HIGH (ref 0.00–0.07)
Basophils Absolute: 0.1 10*3/uL (ref 0.0–0.1)
Basophils Relative: 0 %
Eosinophils Absolute: 0.2 10*3/uL (ref 0.0–0.5)
Eosinophils Relative: 1 %
HCT: 32.1 % — ABNORMAL LOW (ref 36.0–46.0)
Hemoglobin: 11.4 g/dL — ABNORMAL LOW (ref 12.0–15.0)
Immature Granulocytes: 1 %
Lymphocytes Relative: 19 %
Lymphs Abs: 2.5 10*3/uL (ref 0.7–4.0)
MCH: 30.7 pg (ref 26.0–34.0)
MCHC: 35.5 g/dL (ref 30.0–36.0)
MCV: 86.5 fL (ref 80.0–100.0)
Monocytes Absolute: 0.8 10*3/uL (ref 0.1–1.0)
Monocytes Relative: 6 %
Neutro Abs: 9.6 10*3/uL — ABNORMAL HIGH (ref 1.7–7.7)
Neutrophils Relative %: 73 %
Platelets: 221 10*3/uL (ref 150–400)
RBC: 3.71 MIL/uL — ABNORMAL LOW (ref 3.87–5.11)
RDW: 14.3 % (ref 11.5–15.5)
WBC: 13.3 10*3/uL — ABNORMAL HIGH (ref 4.0–10.5)
nRBC: 0 % (ref 0.0–0.2)

## 2022-06-07 LAB — COMPREHENSIVE METABOLIC PANEL
ALT: 11 U/L (ref 0–44)
AST: 12 U/L — ABNORMAL LOW (ref 15–41)
Albumin: 2.8 g/dL — ABNORMAL LOW (ref 3.5–5.0)
Alkaline Phosphatase: 78 U/L (ref 38–126)
Anion gap: 8 (ref 5–15)
BUN: <5 mg/dL — ABNORMAL LOW (ref 6–20)
CO2: 19 mmol/L — ABNORMAL LOW (ref 22–32)
Calcium: 8.4 mg/dL — ABNORMAL LOW (ref 8.9–10.3)
Chloride: 108 mmol/L (ref 98–111)
Creatinine, Ser: 0.41 mg/dL — ABNORMAL LOW (ref 0.44–1.00)
GFR, Estimated: >60 mL/min (ref >60)
Glucose, Bld: 95 mg/dL (ref 70–99)
Potassium: 3.3 mmol/L — ABNORMAL LOW (ref 3.5–5.1)
Sodium: 135 mmol/L (ref 135–145)
Total Bilirubin: 0.3 mg/dL (ref 0.3–1.2)
Total Protein: 5.9 g/dL — ABNORMAL LOW (ref 6.5–8.1)

## 2022-06-07 LAB — URINALYSIS, ROUTINE W REFLEX MICROSCOPIC
Bilirubin Urine: NEGATIVE
Glucose, UA: NEGATIVE mg/dL
Hgb urine dipstick: NEGATIVE
Ketones, ur: NEGATIVE mg/dL
Nitrite: NEGATIVE
Protein, ur: NEGATIVE mg/dL
Specific Gravity, Urine: 1.004 — ABNORMAL LOW (ref 1.005–1.030)
pH: 6 (ref 5.0–8.0)

## 2022-06-07 LAB — LIPASE, BLOOD: Lipase: 31 U/L (ref 11–51)

## 2022-06-07 NOTE — MAU Provider Note (Incomplete)
History     CSN: [id number]  Arrival date and time: 06/07/22 2013   Event Date/Time   First Provider Initiated Contact with Patient 06/07/22 2117      Chief Complaint  Patient presents with  . Abdominal Pain    Catherine Paul is a 27 y.o. G2P1001 at [redacted]w[redacted]d who receives care at [facility]. She reports her next appt is Monday May 13th. She presents today for abdominal discomfort.  She states around 5pm she experienced belly tightness and "really uncomfortable."  She states the pain was constant and radiated to under her ribs and left side.  She states the pain was a 5 at its worse and 3 when better.  She states the pain "laxed off around 1900," but returned while en route at 1945.  She states the pain was worse with laughing or deep breaths.  She denies current pain. She endorses fetal movement and denies vaginal concerns including bleeding, discharge, or leaking.   Chicken Egg Cheese Biscuit   Fruit  Jambalaya   Medications: PNV and bASA OB History     Gravida  2   Para  1   Term  1   Preterm  0   AB  0   Living  1      SAB  0   IAB  0   Ectopic  0   Multiple  0   Live Births  1           Past Medical History:  Diagnosis Date  . Hypertension   . Pregnancy induced hypertension     Past Surgical History:  Procedure Laterality Date  . MASS EXCISION Left 09/24/2021   Procedure: EXCISION OF LEFT MASS/ACCESSORY BREAST TISSUE;  Surgeon: Peggye Form, DO;  Location: [facility];  Service: Plastics;  Laterality: Left;  requesting 30 mins    Family History  Problem Relation Age of Onset  . Lumbar disc disease Paternal Aunt   . Diabetes Maternal Grandmother   . Hypertension Maternal Grandmother   . Cancer Maternal Grandfather   . Colon cancer Maternal Grandfather   . Hypertension Maternal Grandfather   . Stroke Maternal Grandfather   . Hypertension Paternal Grandmother   . Hypertension Paternal Grandfather     Social  History   Tobacco Use  . Smoking status: Never  . Smokeless tobacco: Never  Vaping Use  . Vaping Use: Never used  Substance Use Topics  . Alcohol use: Not Currently    Comment: 1-3 week  . Drug use: Never    Allergies: No Known Allergies  Medications Prior to Admission  Medication Sig Dispense Refill Last Dose  . aspirin EC 81 MG tablet Take 1 tablet (81 mg total) by mouth daily. Take after 12 weeks for prevention of preeclampsia later in pregnancy 300 tablet 2 06/07/2022  . Prenat-Fe Poly-Methfol-FA-DHA (VITAFOL ULTRA) 29-0.6-0.4-200 MG CAPS Take 1 capsule by mouth daily. 30 capsule 12 06/07/2022  . Prenatal Vit-Fe Fumarate-FA (PRENATAL VITAMINS PO) Take 1 tablet by mouth daily. (Patient not taking: Reported on 04/12/2022)       Review of Systems  Eyes:  Negative for visual disturbance.  Gastrointestinal:  Positive for abdominal pain (Resoloved). Negative for constipation, diarrhea, nausea and vomiting.  Genitourinary:  Negative for difficulty urinating, dysuria, vaginal bleeding and vaginal discharge.  Neurological:  Negative for dizziness, light-headedness and headaches.   Physical Exam   Blood pressure 125/69, pulse (!) 101, temperature 98.2 F (36.8 C), temperature source Oral, resp. rate  18, height 5\' 4"  (1.626 m), weight 85.5 kg, last menstrual period 11/28/2021, SpO2 100 %.  Physical Exam Constitutional:      Appearance: She is well-developed.  HENT:     Head: Normocephalic and atraumatic.  Pulmonary:     Effort: Pulmonary effort is normal. No respiratory distress.  Neurological:     Mental Status: She is alert and oriented to person, place, and time.  Psychiatric:        Mood and Affect: Mood normal.        Behavior: Behavior normal.     Fetal Assessment 155 bpm, Mod Var, +Variable Decels, +Accels Toco: No ctx graphed  MAU Course   Results for orders placed or performed during the hospital encounter of 06/07/22 (from the past 24 hour(s))  Urinalysis, Routine  w reflex microscopic -Urine, Clean Catch     Status: Abnormal   Collection Time: 06/07/22  8:53 PM  Result Value Ref Range   Color, Urine YELLOW YELLOW   APPearance HAZY (A) CLEAR   Specific Gravity, Urine 1.004 (L) 1.005 - 1.030   pH 6.0 5.0 - 8.0   Glucose, UA NEGATIVE NEGATIVE mg/dL   Hgb urine dipstick NEGATIVE NEGATIVE   Bilirubin Urine NEGATIVE NEGATIVE   Ketones, ur NEGATIVE NEGATIVE mg/dL   Protein, ur NEGATIVE NEGATIVE mg/dL   Nitrite NEGATIVE NEGATIVE   Leukocytes,Ua LARGE (A) NEGATIVE   RBC / HPF 0-5 0 - 5 RBC/hpf   WBC, UA 11-20 0 - 5 WBC/hpf   Bacteria, UA RARE (A) NONE SEEN   Squamous Epithelial / HPF 6-10 0 - 5 /HPF   Mucus PRESENT   CBC with Differential/Platelet     Status: Abnormal   Collection Time: 06/07/22 10:45 PM  Result Value Ref Range   WBC 13.3 (H) 4.0 - 10.5 K/uL   RBC 3.71 (L) 3.87 - 5.11 MIL/uL   Hemoglobin 11.4 (L) 12.0 - 15.0 g/dL   HCT 40.9 (L) 81.1 - 91.4 %   MCV 86.5 80.0 - 100.0 fL   MCH 30.7 26.0 - 34.0 pg   MCHC 35.5 30.0 - 36.0 g/dL   RDW 78.2 95.6 - 21.3 %   Platelets 221 150 - 400 K/uL   nRBC 0.0 0.0 - 0.2 %   Neutrophils Relative % 73 %   Neutro Abs 9.6 (H) 1.7 - 7.7 K/uL   Lymphocytes Relative 19 %   Lymphs Abs 2.5 0.7 - 4.0 K/uL   Monocytes Relative 6 %   Monocytes Absolute 0.8 0.1 - 1.0 K/uL   Eosinophils Relative 1 %   Eosinophils Absolute 0.2 0.0 - 0.5 K/uL   Basophils Relative 0 %   Basophils Absolute 0.1 0.0 - 0.1 K/uL   Immature Granulocytes 1 %   Abs Immature Granulocytes 0.08 (H) 0.00 - 0.07 K/uL  Comprehensive metabolic panel     Status: Abnormal   Collection Time: 06/07/22 10:45 PM  Result Value Ref Range   Sodium 135 135 - 145 mmol/L   Potassium 3.3 (L) 3.5 - 5.1 mmol/L   Chloride 108 98 - 111 mmol/L   CO2 19 (L) 22 - 32 mmol/L   Glucose, Bld 95 70 - 99 mg/dL   BUN <5 (L) 6 - 20 mg/dL   Creatinine, Ser 0.86 (L) 0.44 - 1.00 mg/dL   Calcium 8.4 (L) 8.9 - 10.3 mg/dL   Total Protein 5.9 (L) 6.5 - 8.1 g/dL    Albumin 2.8 (L) 3.5 - 5.0 g/dL   AST 12 (L) 15 -  41 U/L   ALT 11 0 - 44 U/L   Alkaline Phosphatase 78 38 - 126 U/L   Total Bilirubin 0.3 0.3 - 1.2 mg/dL   GFR, Estimated >29 >56 mL/min   Anion gap 8 5 - 15  Lipase, blood     Status: None   Collection Time: 06/07/22 10:45 PM  Result Value Ref Range   Lipase 31 11 - 51 U/L   US Abdomen Limited RUQ (LIVER/GB)  Result Date: 06/07/2022 CLINICAL DATA:  Right upper quadrant pain. EXAM: ULTRASOUND ABDOMEN LIMITED RIGHT UPPER QUADRANT COMPARISON:  None Available. FINDINGS: Gallbladder: A 1.2 cm shadowing, echogenic gallstone is seen within the gallbladder lumen. There is no evidence of gallbladder wall thickening (1.9 mm). A positive sonographic Eulah Pont sign is noted by sonographer. Common bile duct: Diameter: 1.6 mm Liver: No focal lesion identified. Within normal limits in parenchymal echogenicity. Portal vein is patent on color Doppler imaging with normal direction of blood flow towards the liver. Other: None. IMPRESSION: Cholelithiasis, in the setting of a positive sonographic Murphy sign, suggestive of acute cholecystitis. Electronically Signed   By: Aram Candela M.D.   On: 06/07/2022 23:12    MDM PE Labs: CBC/D, CMP, Lipase EFM RUQ Ultrasound Assessment and Plan  27 year old G2P1001  SIUP at 27.2 weeks Cat I FT Abdominal Pain-Resolved    -Exam findings discussed. Cherre Robins MSN, CNM 06/07/2022, 9:17 PM   Reassessment (11:24 PM) -Dr. Londell Moh calls and reports that patient with findings suggestive of acute cholecystitis with sonographic murphy's and but no thickening or fluid.  -Dr. Katharine Look consulted and informed of patient status, evaluation, interventions, and results. Advised: *Give Precautions *Schedule    Reassessment (11:50 PM)

## 2022-06-07 NOTE — MAU Note (Signed)
.  Catherine Paul is a 27 y.o. at [redacted]w[redacted]d here in MAU reporting: around 1700 having tight abdominal pain - moved to  upper abdomen under ribs then having it on her left side. Lasted for 2 hours before it subsided, now feeling "tension" on both sides, but worse on left side. Denies VB or LOF. +FM   Onset of complaint: 1700 Pain score: 2 Vitals:   06/07/22 2031  BP: 125/69  Pulse: (!) 101  Resp: 18  Temp: 98.2 F (36.8 C)  SpO2: 100%     FHT:160 Lab orders placed from triage:  UA

## 2022-06-07 NOTE — MAU Provider Note (Incomplete)
History     CSN: [id number]  Arrival date and time: 06/07/22 2013   Event Date/Time   First Provider Initiated Contact with Patient 06/07/22 2117      Chief Complaint  Patient presents with   Abdominal Pain    Catherine Paul is a 27 y.o. G2P1001 at [redacted]w[redacted]d who receives care at [facility]. She reports her next appt is Monday May 13th. She presents today for abdominal discomfort.  She states around 5pm she experienced belly tightness and "really uncomfortable."  She states the pain was constant and radiated to under her ribs and left side.  She states the pain was a 5 at its worse and 3 when better.  She states the pain "laxed off around 1900," but returned while en route at 1945.  She states the pain was worse with laughing or deep breaths.  She denies current pain. She endorses fetal movement and denies vaginal concerns including bleeding, discharge, or leaking.   Chicken Egg Cheese Biscuit   Fruit  Jambalaya   Medications: PNV and bASA OB History     Gravida  2   Para  1   Term  1   Preterm  0   AB  0   Living  1      SAB  0   IAB  0   Ectopic  0   Multiple  0   Live Births  1           Past Medical History:  Diagnosis Date   Hypertension    Pregnancy induced hypertension     Past Surgical History:  Procedure Laterality Date   MASS EXCISION Left 09/24/2021   Procedure: EXCISION OF LEFT MASS/ACCESSORY BREAST TISSUE;  Surgeon: Peggye Form, DO;  Location: [facility];  Service: Plastics;  Laterality: Left;  requesting 30 mins    Family History  Problem Relation Age of Onset   Lumbar disc disease Paternal Aunt    Diabetes Maternal Grandmother    Hypertension Maternal Grandmother    Cancer Maternal Grandfather    Colon cancer Maternal Grandfather    Hypertension Maternal Grandfather    Stroke Maternal Grandfather    Hypertension Paternal Grandmother    Hypertension Paternal Grandfather     Social History    Tobacco Use   Smoking status: Never   Smokeless tobacco: Never  Vaping Use   Vaping Use: Never used  Substance Use Topics   Alcohol use: Not Currently    Comment: 1-3 week   Drug use: Never    Allergies: No Known Allergies  Medications Prior to Admission  Medication Sig Dispense Refill Last Dose   aspirin EC 81 MG tablet Take 1 tablet (81 mg total) by mouth daily. Take after 12 weeks for prevention of preeclampsia later in pregnancy 300 tablet 2 06/07/2022   Prenat-Fe Poly-Methfol-FA-DHA (VITAFOL ULTRA) 29-0.6-0.4-200 MG CAPS Take 1 capsule by mouth daily. 30 capsule 12 06/07/2022   Prenatal Vit-Fe Fumarate-FA (PRENATAL VITAMINS PO) Take 1 tablet by mouth daily. (Patient not taking: Reported on 04/12/2022)       Review of Systems  Eyes:  Negative for visual disturbance.  Gastrointestinal:  Positive for abdominal pain (Resoloved). Negative for constipation, diarrhea, nausea and vomiting.  Genitourinary:  Negative for difficulty urinating, dysuria, vaginal bleeding and vaginal discharge.  Neurological:  Negative for dizziness, light-headedness and headaches.   Physical Exam   Blood pressure 125/69, pulse (!) 101, temperature 98.2 F (36.8 C), temperature source Oral, resp. rate  18, height 5\' 4"  (1.626 m), weight 85.5 kg, last menstrual period 11/28/2021, SpO2 100 %.  Physical Exam Vitals reviewed.  Constitutional:      Appearance: She is well-developed.  HENT:     Head: Normocephalic and atraumatic.  Eyes:     Conjunctiva/sclera: Conjunctivae normal.  Cardiovascular:     Rate and Rhythm: Normal rate.  Pulmonary:     Effort: Pulmonary effort is normal. No respiratory distress.  Abdominal:     Tenderness: There is no abdominal tenderness.     Comments: Gravid, Appears LGA  Musculoskeletal:        General: Normal range of motion.     Cervical back: Normal range of motion.  Skin:    General: Skin is warm and dry.  Neurological:     Mental Status: She is alert and oriented  to person, place, and time.  Psychiatric:        Mood and Affect: Mood normal.        Behavior: Behavior normal.     Fetal Assessment 155 bpm, Mod Var, +Variable Decels, +Accels Toco: No ctx graphed  MAU Course   Results for orders placed or performed during the hospital encounter of 06/07/22 (from the past 24 hour(s))  Urinalysis, Routine w reflex microscopic -Urine, Clean Catch     Status: Abnormal   Collection Time: 06/07/22  8:53 PM  Result Value Ref Range   Color, Urine YELLOW YELLOW   APPearance HAZY (A) CLEAR   Specific Gravity, Urine 1.004 (L) 1.005 - 1.030   pH 6.0 5.0 - 8.0   Glucose, UA NEGATIVE NEGATIVE mg/dL   Hgb urine dipstick NEGATIVE NEGATIVE   Bilirubin Urine NEGATIVE NEGATIVE   Ketones, ur NEGATIVE NEGATIVE mg/dL   Protein, ur NEGATIVE NEGATIVE mg/dL   Nitrite NEGATIVE NEGATIVE   Leukocytes,Ua LARGE (A) NEGATIVE   RBC / HPF 0-5 0 - 5 RBC/hpf   WBC, UA 11-20 0 - 5 WBC/hpf   Bacteria, UA RARE (A) NONE SEEN   Squamous Epithelial / HPF 6-10 0 - 5 /HPF   Mucus PRESENT   CBC with Differential/Platelet     Status: Abnormal   Collection Time: 06/07/22 10:45 PM  Result Value Ref Range   WBC 13.3 (H) 4.0 - 10.5 K/uL   RBC 3.71 (L) 3.87 - 5.11 MIL/uL   Hemoglobin 11.4 (L) 12.0 - 15.0 g/dL   HCT 81.1 (L) 91.4 - 78.2 %   MCV 86.5 80.0 - 100.0 fL   MCH 30.7 26.0 - 34.0 pg   MCHC 35.5 30.0 - 36.0 g/dL   RDW 95.6 21.3 - 08.6 %   Platelets 221 150 - 400 K/uL   nRBC 0.0 0.0 - 0.2 %   Neutrophils Relative % 73 %   Neutro Abs 9.6 (H) 1.7 - 7.7 K/uL   Lymphocytes Relative 19 %   Lymphs Abs 2.5 0.7 - 4.0 K/uL   Monocytes Relative 6 %   Monocytes Absolute 0.8 0.1 - 1.0 K/uL   Eosinophils Relative 1 %   Eosinophils Absolute 0.2 0.0 - 0.5 K/uL   Basophils Relative 0 %   Basophils Absolute 0.1 0.0 - 0.1 K/uL   Immature Granulocytes 1 %   Abs Immature Granulocytes 0.08 (H) 0.00 - 0.07 K/uL  Comprehensive metabolic panel     Status: Abnormal   Collection Time:  06/07/22 10:45 PM  Result Value Ref Range   Sodium 135 135 - 145 mmol/L   Potassium 3.3 (L) 3.5 - 5.1 mmol/L  Chloride 108 98 - 111 mmol/L   CO2 19 (L) 22 - 32 mmol/L   Glucose, Bld 95 70 - 99 mg/dL   BUN <5 (L) 6 - 20 mg/dL   Creatinine, Ser 4.09 (L) 0.44 - 1.00 mg/dL   Calcium 8.4 (L) 8.9 - 10.3 mg/dL   Total Protein 5.9 (L) 6.5 - 8.1 g/dL   Albumin 2.8 (L) 3.5 - 5.0 g/dL   AST 12 (L) 15 - 41 U/L   ALT 11 0 - 44 U/L   Alkaline Phosphatase 78 38 - 126 U/L   Total Bilirubin 0.3 0.3 - 1.2 mg/dL   GFR, Estimated >81 >19 mL/min   Anion gap 8 5 - 15  Lipase, blood     Status: None   Collection Time: 06/07/22 10:45 PM  Result Value Ref Range   Lipase 31 11 - 51 U/L   US Abdomen Limited RUQ (LIVER/GB)  Result Date: 06/07/2022 CLINICAL DATA:  Right upper quadrant pain. EXAM: ULTRASOUND ABDOMEN LIMITED RIGHT UPPER QUADRANT COMPARISON:  None Available. FINDINGS: Gallbladder: A 1.2 cm shadowing, echogenic gallstone is seen within the gallbladder lumen. There is no evidence of gallbladder wall thickening (1.9 mm). A positive sonographic Eulah Pont sign is noted by sonographer. Common bile duct: Diameter: 1.6 mm Liver: No focal lesion identified. Within normal limits in parenchymal echogenicity. Portal vein is patent on color Doppler imaging with normal direction of blood flow towards the liver. Other: None. IMPRESSION: Cholelithiasis, in the setting of a positive sonographic Murphy sign, suggestive of acute cholecystitis. Electronically Signed   By: Aram Candela M.D.   On: 06/07/2022 23:12    MDM PE Labs: CBC/D, CMP, Lipase EFM RUQ Ultrasound Assessment and Plan  27 year old G2P1001  SIUP at 27.2 weeks Cat I FT Abdominal Pain-Resolved  -POC Reviewed -Exam performed and findings discussed. -Informed that symptoms are highly suspicious for cholecystitis especially with c/o waxing and waning pain. -Informed that absence of current pain is reassuring and suggestive of passing of     Cherre Robins MSN, CNM 06/07/2022, 9:17 PM   Reassessment (11:24 PM) -Dr. Londell Moh calls and reports that patient with findings suggestive of acute cholecystitis with sonographic murphy's and but no thickening or fluid.  -Dr. Katharine Look consulted and informed of patient status, evaluation, interventions, and results. Advised: *Will call general surgery for guidance.  -Patient informed of findings and plan.  Reassessment (11:50 PM) -Dr. Katharine Look returns call and reports per Almond Lint, MD to *Okay to discharge in setting of no current pain, normal labs and afebrile. *Give good precautions. *No need for follow up. -Provider to bedside to discuss POC. -Discussed No to Low fat diet to avoid gallbladder crisis. -Return precautions given. -Information on cholecystitis and eating plan placed in AVS. -Given information for [facility] in case desires to follow up.  -Patient verbalizes understanding. -Instructed to keep next appt as scheduled. -Encouraged to call primary office or return to MAU if symptoms worsen or with the onset of new symptoms. -Discharged to home in stable condition.  Cherre Robins MSN, CNM Advanced Practice Provider, [facility] [employer]

## 2022-06-08 DIAGNOSIS — O26612 Liver and biliary tract disorders in pregnancy, second trimester: Secondary | ICD-10-CM | POA: Diagnosis not present

## 2022-06-11 ENCOUNTER — Ambulatory Visit: Payer: BC Managed Care – PPO | Attending: Advanced Practice Midwife | Admitting: Physical Therapy

## 2022-06-11 DIAGNOSIS — R293 Abnormal posture: Secondary | ICD-10-CM | POA: Diagnosis present

## 2022-06-11 DIAGNOSIS — M62838 Other muscle spasm: Secondary | ICD-10-CM | POA: Insufficient documentation

## 2022-06-11 DIAGNOSIS — M6281 Muscle weakness (generalized): Secondary | ICD-10-CM | POA: Diagnosis present

## 2022-06-11 NOTE — Therapy (Signed)
OUTPATIENT PHYSICAL THERAPY FEMALE PELVIC TREATMENT   Patient Name: Catherine Paul MRN: [id number] DOB:1995/03/29, 27 y.o., female Today's Date: 06/11/2022  END OF SESSION:  PT End of Session - 06/11/22 1136     Visit Number 4    Date for PT Re-Evaluation 06/23/22    Authorization Type BCBS    PT Start Time 0933    PT Stop Time 1015    PT Time Calculation (min) 42 min    Activity Tolerance Patient tolerated treatment well    Behavior During Therapy [facility] for tasks assessed/performed                Past Medical History:  Diagnosis Date   Hypertension    Pregnancy induced hypertension    Past Surgical History:  Procedure Laterality Date   MASS EXCISION Left 09/24/2021   Procedure: EXCISION OF LEFT MASS/ACCESSORY BREAST TISSUE;  Surgeon: Peggye Form, DO;  Location: [facility];  Service: Plastics;  Laterality: Left;  requesting 30 mins   Patient Active Problem List   Diagnosis Date Noted   History of gestational hypertension 02/22/2022   Supervision of other normal pregnancy, antepartum 02/09/2022   Axillary mass, left 07/30/2021   Vacuum-assisted vaginal delivery 03/13/2020   Sciatica without back pain 11/15/2019    PCP: General  REFERRING PROVIDER: Hurshel Party, CNM   REFERRING DIAG:  [phone] (ICD-10-CM) - Pelvic pain affecting pregnancy in second trimester, antepartum  M54.30 (ICD-10-CM) - Sciatica without back pain, unspecified laterality    THERAPY DIAG:  Abnormal posture  Muscle weakness (generalized)  Other muscle spasm  Rationale for Evaluation and Treatment: Rehabilitation  ONSET DATE: 11 weeks into pregnancy  SUBJECTIVE:                                                                                                                                                                                           SUBJECTIVE STATEMENT: Doing things in shorter increments and doing well with that.  Fluid  intake: Yes: as much water as I can    PAIN:  Are you having pain? No (it is usually after a lot of activity)   PRECAUTIONS: Other: pregnancy  WEIGHT BEARING RESTRICTIONS: No  FALLS:  Has patient fallen in last 6 months? 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  PATIENT GOALS: be able to have more mobility and be able to walk without pain or without aggravating the back  PERTINENT HISTORY:  Vaginal delivery with 2nd deg tear;  pregnant Sexual abuse: No  BOWEL MOVEMENT: Pain with bowel movement: No Type of bowel movement:Type (Bristol Stool Scale) normal, Frequency normal, and Strain No   URINATION: Pain with urination: No Fully empty bladder: No Stream: Strong Urgency: No Frequency: normal Leakage: Sneezing (not much) Pads: No  INTERCOURSE: Pain with intercourse:  not that I can remember Ability to have vaginal penetration:  Yes:     PREGNANCY: Vaginal deliveries 1 Tearing Yes: 2nd deg   PROLAPSE: None   OBJECTIVE:   DIAGNOSTIC FINDINGS:    PATIENT SURVEYS:    PFIQ-7   COGNITION: Overall cognitive status: Within functional limits for tasks assessed     SENSATION: Light touch: Appears intact Proprioception: Appears intact  MUSCLE LENGTH: Hamstrings: Right 80 deg; Left 70 deg Thomas test:   LUMBAR SPECIAL TESTS:    FUNCTIONAL TESTS:  Single leg stand - Rt trendelenburg  GAIT:  Comments: WFL   POSTURE: anterior pelvic tilt  PELVIC ALIGNMENT:  LUMBARAROM/PROM:  A/PROM A/PROM  eval  Flexion 50%  Extension   Right lateral flexion   Left lateral flexion   Right rotation   Left rotation    (Blank rows = not tested)  LOWER EXTREMITY ROM:  Passive ROM Right eval Left eval  Hip flexion 80% 80%  Hip extension    Hip abduction    Hip adduction    Hip internal rotation [facility]  WFL   Hip  external rotation [facility]   Knee flexion    Knee extension    Ankle dorsiflexion    Ankle plantarflexion    Ankle inversion    Ankle eversion     (Blank rows = not tested)  LOWER EXTREMITY MMT:  MMT Right eval Left eval  Hip flexion 4/5 5  Hip extension    Hip abduction 4/5 5  Hip adduction 4/5 5  Hip internal rotation 5 5  Hip external rotation 5 5  Knee flexion    Knee extension    Ankle dorsiflexion    Ankle plantarflexion    Ankle inversion    Ankle eversion     PALPATION:   General  adductors, hamstrings, lumbar paraspinals tight                External Perineal Exam normal appearance slightly increased redness of the vulva                             Internal Pelvic Floor hard to relax after contracting  Patient confirms identification and approves PT to assess internal pelvic floor and treatment Yes  PELVIC MMT:   MMT eval  Vaginal 4/5 x 5 reps, held 1 for 17 seconds  Internal Anal Sphincter   External Anal Sphincter   Puborectalis   Diastasis Recti   (Blank rows = not tested)        TONE: Normal   PROLAPSE: no  TODAY'S TREATMENT:  DATE: 06/11/22   Exercises: H/s and hip flexor stretch vibration plate Fwd and side lunges on vibration plate Row 7lb pulley - 20x Pallof and side step 7lb pulley - 20x Lift 5lb standing against the wall - 20x Quadruped - LE/UE alternating Cat cow Half kneeling - 5lb diagonals   Manual: Addaday to bil gluteals   PATIENT EDUCATION:  Education details: Access Code: [email or web address] Person educated: Patient Education method: Explanation, Demonstration, Tactile cues, Verbal cues, and Handouts Education comprehension: verbalized understanding and returned demonstration  HOME EXERCISE PROGRAM: Access Code: [email or web address] URL: [email or web address] Date: 05/14/2022 Prepared by:  Dwana Curd  Exercises - Quadruped Circle Weight Shifts  - 1 x daily - 7 x weekly - 3 sets - 10 reps - Cat Cow  - 1 x daily - 7 x weekly - 3 sets - 10 reps - Standing Hamstring Stretch with Step  - 1 x daily - 7 x weekly - 1 sets - 3 reps - 30 sec hold - Standing Hip Flexor Stretch  - 3 x daily - 7 x weekly - 1 sets - 2 reps - 30 sec hold - Standing 'L' Stretch at [employer]  - 1 x daily - 7 x weekly - 3 sets - 10 reps - Thoracic Sidebending on Swiss Ball  - 1 x daily - 7 x weekly - 3 sets - 10 reps - Quadruped Alternating Arm Lift  - 1 x daily - 7 x weekly - 2 sets - 10 reps - Rock  - 1 x daily - 7 x weekly - 3 sets - 10 reps - Clamshell  - 1 x daily - 7 x weekly - 3 sets - 10 reps  ASSESSMENT:  CLINICAL IMPRESSION: Today's session focused on core activation and coordination with exhale for improved pressure management without overusing back muscles.  Pt is currently [redacted] weeks gestation.  Pt is still working on more advanced strength for core and hip strength in order to be able to sustain activity level for safe and active pregnancy.  Pt has made progress overall with less pain and less leakage.  Pt will benefit from PT to address all above mentioned impairments for improved healthy pregnancy.  OBJECTIVE IMPAIRMENTS: decreased activity tolerance, decreased coordination, decreased endurance, decreased ROM, decreased strength, increased muscle spasms, impaired flexibility, impaired tone, postural dysfunction, and pain.   ACTIVITY LIMITATIONS: standing and continence  PARTICIPATION LIMITATIONS: community activity and occupation  PERSONAL FACTORS: Time since onset of injury/illness/exacerbation and 1-2 comorbidities: currently [redacted] weeks pregnant, history of 2nd deg tear  are also affecting patient's functional outcome.   REHAB POTENTIAL: Excellent  CLINICAL DECISION MAKING: Evolving/moderate complexity  EVALUATION COMPLEXITY: Moderate   GOALS: Goals reviewed with patient?  Yes  SHORT TERM GOALS: Target date: 04/28/22 Updated 05/28/22   Ind with initial HEP Baseline: Goal status: MET  2.  Pt Reports 20% less pain at work Baseline:  Goal status: MET  LONG TERM GOALS: Target date: 06/23/22 Updated 06/11/22   Pt will be independent with advanced HEP to maintain improvements made throughout therapy  Baseline:  Goal status: IN PROGRESS  2.  Pt will be able to functional actions such as sneezing without leakage  Baseline: couple of Saturdays ago I leaked when sneezing but other than that, it has been better Goal status: IN PROGRESS  3.  Pt will report 80% reduction of pain due to improvements in posture, strength, and muscle length  Baseline: 60-70% Goal status: IN PROGRESS  4.  Pt will not need  to take Tylenol due to ability to manage pain with strengthening and stretches Baseline: have not had to take for pain the last couple weeks, but have to rest when having pain Goal status: IN PROGRESS    PLAN:  PT FREQUENCY: 1x/week  PT DURATION: 12 weeks  PLANNED INTERVENTIONS: Therapeutic exercises, Therapeutic activity, Neuromuscular re-education, Balance training, Gait training, Patient/Family education, Self Care, Joint mobilization, Aquatic Therapy, Electrical stimulation, Cryotherapy, Moist heat, Taping, Biofeedback, Manual therapy, and Re-evaluation  PLAN FOR NEXT SESSION: progress core strength and HEP, continue to work on soft tissue release lumbar, thoracic and gluteal release   Jakki L Henna Derderian, PT 06/11/2022, 11:36 AM

## 2022-06-14 ENCOUNTER — Encounter: Payer: Self-pay | Admitting: Advanced Practice Midwife

## 2022-06-14 ENCOUNTER — Ambulatory Visit (INDEPENDENT_AMBULATORY_CARE_PROVIDER_SITE_OTHER): Payer: BC Managed Care – PPO | Admitting: Advanced Practice Midwife

## 2022-06-14 ENCOUNTER — Other Ambulatory Visit: Payer: BC Managed Care – PPO

## 2022-06-14 VITALS — BP 121/77 | HR 99 | Wt 185.1 lb

## 2022-06-14 DIAGNOSIS — K8020 Calculus of gallbladder without cholecystitis without obstruction: Secondary | ICD-10-CM

## 2022-06-14 DIAGNOSIS — Z23 Encounter for immunization: Secondary | ICD-10-CM

## 2022-06-14 DIAGNOSIS — O99619 Diseases of the digestive system complicating pregnancy, unspecified trimester: Secondary | ICD-10-CM

## 2022-06-14 DIAGNOSIS — Z3A28 28 weeks gestation of pregnancy: Secondary | ICD-10-CM

## 2022-06-14 DIAGNOSIS — Z8759 Personal history of other complications of pregnancy, childbirth and the puerperium: Secondary | ICD-10-CM

## 2022-06-14 DIAGNOSIS — Z3480 Encounter for supervision of other normal pregnancy, unspecified trimester: Secondary | ICD-10-CM

## 2022-06-14 NOTE — Progress Notes (Signed)
   PRENATAL VISIT NOTE  Subjective:  Catherine Paul is a 27 y.o. G2P1001 at [redacted]w[redacted]d being seen today for ongoing prenatal care.  She is currently monitored for the following issues for this low-risk pregnancy and has Sciatica without back pain; Vacuum-assisted vaginal delivery; Supervision of other normal pregnancy, antepartum; Axillary mass, left; and History of gestational hypertension on their problem list.  Patient reports  pt with upper abdomen pain with gall .  Contractions: Irritability. Vag. Bleeding: None.  Movement: Present. Denies leaking of fluid.   The following portions of the patient's history were reviewed and updated as appropriate: allergies, current medications, past family history, past medical history, past social history, past surgical history and problem list.   Objective:   Vitals:   06/14/22 0857  BP: 121/77  Pulse: 99  Weight: 185 lb 1.6 oz (84 kg)    Fetal Status: Fetal Heart Rate (bpm): 148 Fundal Height: 28 cm Movement: Present     General:  Alert, oriented and cooperative. Patient is in no acute distress.  Skin: Skin is warm and dry. No rash noted.   Cardiovascular: Normal heart rate noted  Respiratory: Normal respiratory effort, no problems with respiration noted  Abdomen: Soft, gravid, appropriate for gestational age.  Pain/Pressure: Absent     Pelvic: Cervical exam deferred        Extremities: Normal range of motion.  Edema: None  Mental Status: Normal mood and affect. Normal behavior. Normal judgment and thought content.   Assessment and Plan:  Pregnancy: G2P1001 at [redacted]w[redacted]d 1. Supervision of other normal pregnancy, antepartum --Anticipatory guidance about next visits/weeks of pregnancy given.  - Pt interested in waterbirth and has attended the class.  - Reviewed conditions in labor that will risk her out of water immersion including thick meconium or blood stained amniotic fluid, non-reassuring fetal status on monitor, excessive bleeding,  hypertension, dizziness, use of IV meds, damaged equipment or staffing that does not allow for water immersion, etc.  - Discussed other labor support options if waterbirth becomes unavailable, including position change, freedom of movement, use of birthing ball, and/or use of hydrotherapy in the shower (dependent upon medical condition/provider discretion).    2. History of gestational hypertension --BP wnl, no s/sx ofPEC today  3. [redacted] weeks gestation of pregnancy  4. Cholecystolithiasis affecting pregnancy, antepartum --Seen in MAU on 5/8, symptoms improved with low fat diet.    Preterm labor symptoms and general obstetric precautions including but not limited to vaginal bleeding, contractions, leaking of fluid and fetal movement were reviewed in detail with the patient. Please refer to After Visit Summary for other counseling recommendations.   Return in about 2 weeks (around 06/28/2022) for Midwife preferred, LOB.  Future Appointments  Date Time Provider Department Center  06/15/2022  9:30 AM Desenglau, Shireen Quan, PT [facility] None  06/25/2022  9:30 AM Desenglau, Shireen Quan, PT [facility] None  07/12/2022  7:45 AM [facility] NURSE [facility]  07/12/2022  8:00 AM [facility] US1 [facility]    Sharen Counter, CNM

## 2022-06-14 NOTE — Progress Notes (Signed)
Pt presents for ROB visit. Pt c/o vaginal irritation. No other concerns.

## 2022-06-15 ENCOUNTER — Ambulatory Visit: Payer: BC Managed Care – PPO | Admitting: Physical Therapy

## 2022-06-15 DIAGNOSIS — R293 Abnormal posture: Secondary | ICD-10-CM | POA: Diagnosis not present

## 2022-06-15 DIAGNOSIS — M62838 Other muscle spasm: Secondary | ICD-10-CM

## 2022-06-15 DIAGNOSIS — M6281 Muscle weakness (generalized): Secondary | ICD-10-CM

## 2022-06-15 LAB — GLUCOSE TOLERANCE, 2 HOURS W/ 1HR
Glucose, 1 hour: 161 mg/dL (ref 70–179)
Glucose, 2 hour: 116 mg/dL (ref 70–152)
Glucose, Fasting: 77 mg/dL (ref 70–91)

## 2022-06-15 LAB — RPR: RPR Ser Ql: NONREACTIVE

## 2022-06-15 LAB — CBC
Hematocrit: 38.8 % (ref 34.0–46.6)
Hemoglobin: 12.7 g/dL (ref 11.1–15.9)
MCH: 30.1 pg (ref 26.6–33.0)
MCHC: 32.7 g/dL (ref 31.5–35.7)
MCV: 92 fL (ref 79–97)
Platelets: 240 10*3/uL (ref 150–450)
RBC: 4.22 x10E6/uL (ref 3.77–5.28)
RDW: 14 % (ref 11.7–15.4)
WBC: 11.8 10*3/uL — ABNORMAL HIGH (ref 3.4–10.8)

## 2022-06-15 LAB — HIV ANTIBODY (ROUTINE TESTING W REFLEX): HIV Screen 4th Generation wRfx: NONREACTIVE

## 2022-06-15 NOTE — Therapy (Signed)
OUTPATIENT PHYSICAL THERAPY FEMALE PELVIC TREATMENT   Patient Name: Catherine Paul MRN: [id number] DOB:1995-07-17, 27 y.o., female Today's Date: 06/15/2022  END OF SESSION:  PT End of Session - 06/15/22 0934     Visit Number 5    Date for PT Re-Evaluation 06/23/22    Authorization Type BCBS    PT Start Time 0933    PT Stop Time 1013    PT Time Calculation (min) 40 min    Activity Tolerance Patient tolerated treatment well    Behavior During Therapy [facility] for tasks assessed/performed                 Past Medical History:  Diagnosis Date   Gallstones    Hypertension    Pregnancy induced hypertension    Past Surgical History:  Procedure Laterality Date   MASS EXCISION Left 09/24/2021   Procedure: EXCISION OF LEFT MASS/ACCESSORY BREAST TISSUE;  Surgeon: Peggye Form, DO;  Location: [facility];  Service: Plastics;  Laterality: Left;  requesting 30 mins   Patient Active Problem List   Diagnosis Date Noted   History of gestational hypertension 02/22/2022   Supervision of other normal pregnancy, antepartum 02/09/2022   Axillary mass, left 07/30/2021   Vacuum-assisted vaginal delivery 03/13/2020   Sciatica without back pain 11/15/2019    PCP: General  REFERRING PROVIDER: Hurshel Party, CNM   REFERRING DIAG:  [phone] (ICD-10-CM) - Pelvic pain affecting pregnancy in second trimester, antepartum  M54.30 (ICD-10-CM) - Sciatica without back pain, unspecified laterality    THERAPY DIAG:  Abnormal posture  Muscle weakness (generalized)  Other muscle spasm  Rationale for Evaluation and Treatment: Rehabilitation  ONSET DATE: 11 weeks into pregnancy  SUBJECTIVE:                                                                                                                                                                                           SUBJECTIVE STATEMENT: Pt able to walk at the zoo and not be down for the  rest of the day. Fluid intake: Yes: as much water as I can    PAIN:  Are you having pain? No (it is usually after a lot of activity)   PRECAUTIONS: Other: pregnancy  WEIGHT BEARING RESTRICTIONS: No  FALLS:  Has patient fallen in last 6 months? 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  PATIENT GOALS: be able to have more mobility and be able to walk without pain or without aggravating the  back  PERTINENT HISTORY:  Vaginal delivery with 2nd deg tear; pregnant Sexual abuse: No  BOWEL MOVEMENT: Pain with bowel movement: No Type of bowel movement:Type (Bristol Stool Scale) normal, Frequency normal, and Strain No   URINATION: Pain with urination: No Fully empty bladder: No Stream: Strong Urgency: No Frequency: normal Leakage: Sneezing (not much) Pads: No  INTERCOURSE: Pain with intercourse:  not that I can remember Ability to have vaginal penetration:  Yes:     PREGNANCY: Vaginal deliveries 1 Tearing Yes: 2nd deg   PROLAPSE: None   OBJECTIVE:   DIAGNOSTIC FINDINGS:    PATIENT SURVEYS:    PFIQ-7   COGNITION: Overall cognitive status: Within functional limits for tasks assessed     SENSATION: Light touch: Appears intact Proprioception: Appears intact  MUSCLE LENGTH: Hamstrings: Right 80 deg; Left 70 deg Thomas test:   LUMBAR SPECIAL TESTS:    FUNCTIONAL TESTS:  Single leg stand - Rt trendelenburg  GAIT:  Comments: WFL   POSTURE: anterior pelvic tilt  PELVIC ALIGNMENT:  LUMBARAROM/PROM:  A/PROM A/PROM  eval  Flexion 50%  Extension   Right lateral flexion   Left lateral flexion   Right rotation   Left rotation    (Blank rows = not tested)  LOWER EXTREMITY ROM:  Passive ROM Right eval Left eval  Hip flexion 80% 80%  Hip extension    Hip abduction    Hip adduction    Hip internal  rotation [facility]  WFL   Hip external rotation [facility]   Knee flexion    Knee extension    Ankle dorsiflexion    Ankle plantarflexion    Ankle inversion    Ankle eversion     (Blank rows = not tested)  LOWER EXTREMITY MMT:  MMT Right eval Left eval  Hip flexion 4/5 5  Hip extension    Hip abduction 4/5 5  Hip adduction 4/5 5  Hip internal rotation 5 5  Hip external rotation 5 5  Knee flexion    Knee extension    Ankle dorsiflexion    Ankle plantarflexion    Ankle inversion    Ankle eversion     PALPATION:   General  adductors, hamstrings, lumbar paraspinals tight                External Perineal Exam normal appearance slightly increased redness of the vulva                             Internal Pelvic Floor hard to relax after contracting  Patient confirms identification and approves PT to assess internal pelvic floor and treatment Yes  PELVIC MMT:   MMT eval  Vaginal 4/5 x 5 reps, held 1 for 17 seconds  Internal Anal Sphincter   External Anal Sphincter   Puborectalis   Diastasis Recti   (Blank rows = not tested)        TONE: Normal   PROLAPSE: no  TODAY'S TREATMENT:  DATE: 06/15/22   Exercises: H/s and hip flexor stretch vibration plate Fwd and side lunges on vibration plate Nustep L4 x 5 min with PT present for status Squat - buttocks to mat 15x Goblet squat 10lb - 2 x 10 Row 7lb pulley - 20x  Lift blue band - standing against the wall foam roll- 20x Wall with foam roll behind - horizontal abduction - blue band - 20x Cat cow  Half kneeling - blue band diagonals - 15x each Half kneeling red band UE flexion 15x each Side step and moster walks red band ankles - 5 down and back in parallel bar     PATIENT EDUCATION:  Education details: Access Code: [email or web address] Person educated: Patient Education method: Explanation,  Demonstration, Tactile cues, Verbal cues, and Handouts Education comprehension: verbalized understanding and returned demonstration  HOME EXERCISE PROGRAM: Access Code: [email or web address] URL: [email or web address] Date: 05/14/2022 Prepared by: Dwana Curd  Exercises - Quadruped Circle Weight Shifts  - 1 x daily - 7 x weekly - 3 sets - 10 reps - Cat Cow  - 1 x daily - 7 x weekly - 3 sets - 10 reps - Standing Hamstring Stretch with Step  - 1 x daily - 7 x weekly - 1 sets - 3 reps - 30 sec hold - Standing Hip Flexor Stretch  - 3 x daily - 7 x weekly - 1 sets - 2 reps - 30 sec hold - Standing 'L' Stretch at Counter  - 1 x daily - 7 x weekly - 3 sets - 10 reps - Thoracic Sidebending on Swiss Ball  - 1 x daily - 7 x weekly - 3 sets - 10 reps - Quadruped Alternating Arm Lift  - 1 x daily - 7 x weekly - 2 sets - 10 reps - Rock  - 1 x daily - 7 x weekly - 3 sets - 10 reps - Clamshell  - 1 x daily - 7 x weekly - 3 sets - 10 reps  ASSESSMENT:  CLINICAL IMPRESSION: Today's session continues to work on core strength and increased functional activities.  Pt able to do more resistance and added squats today.  Overall, pt not having as much pain. Pt will benefit from PT to address all above mentioned impairments for improved healthy pregnancy.  OBJECTIVE IMPAIRMENTS: decreased activity tolerance, decreased coordination, decreased endurance, decreased ROM, decreased strength, increased muscle spasms, impaired flexibility, impaired tone, postural dysfunction, and pain.   ACTIVITY LIMITATIONS: standing and continence  PARTICIPATION LIMITATIONS: community activity and occupation  PERSONAL FACTORS: Time since onset of injury/illness/exacerbation and 1-2 comorbidities: currently [redacted] weeks pregnant, history of 2nd deg tear  are also affecting patient's functional outcome.   REHAB POTENTIAL: Excellent  CLINICAL DECISION MAKING: Evolving/moderate complexity  EVALUATION COMPLEXITY:  Moderate   GOALS: Goals reviewed with patient? Yes  SHORT TERM GOALS: Target date: 04/28/22 Updated 05/28/22   Ind with initial HEP Baseline: Goal status: MET  2.  Pt Reports 20% less pain at work Baseline:  Goal status: MET  LONG TERM GOALS: Target date: 06/23/22 Updated 06/11/22   Pt will be independent with advanced HEP to maintain improvements made throughout therapy  Baseline:  Goal status: IN PROGRESS  2.  Pt will be able to functional actions such as sneezing without leakage  Baseline: couple of Saturdays ago I leaked when sneezing but other than that, it has been better Goal status: IN PROGRESS  3.  Pt will report 80% reduction of pain due to improvements in posture, strength,  and muscle length  Baseline: 60-70% Goal status: IN PROGRESS  4.  Pt will not need to take Tylenol due to ability to manage pain with strengthening and stretches Baseline: have not had to take for pain the last couple weeks, but have to rest when having pain Goal status: IN PROGRESS    PLAN:  PT FREQUENCY: 1x/week  PT DURATION: 12 weeks  PLANNED INTERVENTIONS: Therapeutic exercises, Therapeutic activity, Neuromuscular re-education, Balance training, Gait training, Patient/Family education, Self Care, Joint mobilization, Aquatic Therapy, Electrical stimulation, Cryotherapy, Moist heat, Taping, Biofeedback, Manual therapy, and Re-evaluation  PLAN FOR NEXT SESSION: progress core strength and HEP, continue to work on soft tissue release lumbar, thoracic and gluteal release   Jakki L Tene Gato, PT 06/15/2022, 9:35 AM

## 2022-06-18 ENCOUNTER — Inpatient Hospital Stay (HOSPITAL_COMMUNITY)
Admission: AD | Admit: 2022-06-18 | Discharge: 2022-06-19 | Disposition: A | Discharge status: Home / Self Care | Payer: BC Managed Care – PPO | Attending: Obstetrics and Gynecology | Admitting: Obstetrics and Gynecology

## 2022-06-18 ENCOUNTER — Encounter (HOSPITAL_COMMUNITY): Payer: Self-pay | Admitting: Obstetrics and Gynecology

## 2022-06-18 DIAGNOSIS — O26893 Other specified pregnancy related conditions, third trimester: Secondary | ICD-10-CM | POA: Insufficient documentation

## 2022-06-18 DIAGNOSIS — O26899 Other specified pregnancy related conditions, unspecified trimester: Secondary | ICD-10-CM

## 2022-06-18 DIAGNOSIS — Z3A29 29 weeks gestation of pregnancy: Secondary | ICD-10-CM

## 2022-06-18 DIAGNOSIS — Z3480 Encounter for supervision of other normal pregnancy, unspecified trimester: Secondary | ICD-10-CM

## 2022-06-18 DIAGNOSIS — R109 Unspecified abdominal pain: Secondary | ICD-10-CM | POA: Insufficient documentation

## 2022-06-18 DIAGNOSIS — O10913 Unspecified pre-existing hypertension complicating pregnancy, third trimester: Secondary | ICD-10-CM | POA: Insufficient documentation

## 2022-06-18 DIAGNOSIS — Z3A28 28 weeks gestation of pregnancy: Secondary | ICD-10-CM | POA: Diagnosis not present

## 2022-06-18 DIAGNOSIS — Z8759 Personal history of other complications of pregnancy, childbirth and the puerperium: Secondary | ICD-10-CM

## 2022-06-18 DIAGNOSIS — Z3689 Encounter for other specified antenatal screening: Secondary | ICD-10-CM

## 2022-06-18 DIAGNOSIS — B3731 Acute candidiasis of vulva and vagina: Secondary | ICD-10-CM

## 2022-06-18 LAB — URINALYSIS, ROUTINE W REFLEX MICROSCOPIC
Bilirubin Urine: NEGATIVE
Glucose, UA: NEGATIVE mg/dL
Hgb urine dipstick: NEGATIVE
Ketones, ur: 5 mg/dL — AB
Nitrite: NEGATIVE
Protein, ur: NEGATIVE mg/dL
Specific Gravity, Urine: 1.012 (ref 1.005–1.030)
pH: 6 (ref 5.0–8.0)

## 2022-06-18 LAB — WET PREP, GENITAL
Clue Cells Wet Prep HPF POC: NONE SEEN
Sperm: NONE SEEN
Trich, Wet Prep: NONE SEEN
WBC, Wet Prep HPF POC: >=10 — AB (ref <10)
Yeast Wet Prep HPF POC: NONE SEEN

## 2022-06-18 MED ORDER — CYCLOBENZAPRINE HCL 5 MG PO TABS
10.0000 mg | ORAL_TABLET | Freq: Once | ORAL | Status: AC
  Administered 2022-06-18: 10 mg via ORAL
  Filled 2022-06-18: qty 2

## 2022-06-18 MED ORDER — PROMETHAZINE HCL 25 MG PO TABS
25.0000 mg | ORAL_TABLET | Freq: Once | ORAL | Status: AC
  Administered 2022-06-18: 25 mg via ORAL
  Filled 2022-06-18: qty 1

## 2022-06-18 NOTE — MAU Note (Signed)
Pt says since 730pm- was in shower - sitting- started feeling left sided pain - towards her back - worse now 7/10 [facility]- [facility]

## 2022-06-18 NOTE — MAU Provider Note (Signed)
History     CSN: [id number]  Arrival date and time: 06/18/22 2154   Event Date/Time   First Provider Initiated Contact with Patient 06/18/22 2321      Chief Complaint  Patient presents with   Abdominal Pain   Catherine Paul is a 27 y.o. G2P1001 at [redacted]w[redacted]d who receives care at [facility].  She presents today for left flank pain.  Patient states the pain started around 1930 and was sudden onset.  She states the pain is constant, but has moments when it is worse.  She reports she is unsure of what causes it it worsen, but finds nothing makes it better.  She states the pain is a 7-8/10.  She endorses fetal movement and denies vaginal concerns including, bleeding, leaking, and discharge.     OB History     Gravida  2   Para  1   Term  1   Preterm  0   AB  0   Living  1      SAB  0   IAB  0   Ectopic  0   Multiple  0   Live Births  1           Past Medical History:  Diagnosis Date   Gallstones    Hypertension    Pregnancy induced hypertension     Past Surgical History:  Procedure Laterality Date   MASS EXCISION Left 09/24/2021   Procedure: EXCISION OF LEFT MASS/ACCESSORY BREAST TISSUE;  Surgeon: Peggye Form, DO;  Location:  [facility];  Service: Plastics;  Laterality: Left;  requesting 30 mins    Family History  Problem Relation Age of Onset   Lumbar disc disease Paternal Aunt    Diabetes Maternal Grandmother    Hypertension Maternal Grandmother    Cancer Maternal Grandfather    Colon cancer Maternal Grandfather    Hypertension Maternal Grandfather    Stroke Maternal Grandfather    Hypertension Paternal Grandmother    Hypertension Paternal Grandfather     Social History   Tobacco Use   Smoking status: Never   Smokeless tobacco: Never  Vaping Use   Vaping Use: Never used  Substance Use Topics   Alcohol use: Not Currently    Comment: 1-3 week   Drug use: Never    Allergies: No Known Allergies  Medications  Prior to Admission  Medication Sig Dispense Refill Last Dose   acetaminophen (TYLENOL) 500 MG tablet Take 1,000 mg by mouth every 6 (six) hours as needed.   06/18/2022 at 2030   aspirin EC 81 MG tablet Take 1 tablet (81 mg total) by mouth daily. Take after 12 weeks for prevention of preeclampsia later in pregnancy 300 tablet 2 06/18/2022   Prenat-Fe Poly-Methfol-FA-DHA (VITAFOL ULTRA) 29-0.6-0.4-200 MG CAPS Take 1 capsule by mouth daily. 30 capsule 12 06/18/2022   Prenatal Vit-Fe Fumarate-FA (PRENATAL VITAMINS PO) Take 1 tablet by mouth daily. (Patient not taking: Reported on 04/12/2022)       Review of Systems  Gastrointestinal:  Positive for abdominal pain and vomiting (En route, but none currently). Negative for nausea.  Genitourinary:  Negative for difficulty urinating, dysuria, vaginal bleeding and vaginal discharge.   Physical Exam   Blood pressure 116/69, pulse 92, temperature (!) 97.5 F (36.4 C), temperature source Oral, resp. rate 20, height 5\' 4"  (1.626 m), weight 86.1 kg, last menstrual period 11/28/2021.  Physical Exam Vitals reviewed.  Constitutional:      General: She is in acute distress.  Appearance: She is well-developed.  HENT:     Head: Normocephalic and atraumatic.  Eyes:     Conjunctiva/sclera: Conjunctivae normal.  Cardiovascular:     Rate and Rhythm: Normal rate.  Pulmonary:     Effort: Pulmonary effort is normal. No respiratory distress.  Abdominal:     Palpations: Abdomen is soft.     Tenderness: There is no guarding.  Genitourinary:    Comments: NEFG. White plaques noted at introitus and on labias.  Cultures collected blindly. BME completed and cervix closed. Curdy whitish green discharge noted on exam glove.  Musculoskeletal:        General: Normal range of motion.     Cervical back: Normal range of motion.  Skin:    General: Skin is warm and dry.  Neurological:     Mental Status: She is alert and oriented to person, place, and time.   Psychiatric:        Mood and Affect: Mood normal.        Behavior: Behavior normal.     Fetal Assessment 150 bpm, Mod Var, -Decels, +15x15Accels Toco: Irritability  MAU Course   Results for orders placed or performed during the hospital encounter of 06/18/22 (from the past 24 hour(s))  Urinalysis, Routine w reflex microscopic -Urine, Clean Catch     Status: Abnormal   Collection Time: 06/18/22 10:43 PM  Result Value Ref Range   Color, Urine YELLOW YELLOW   APPearance HAZY (A) CLEAR   Specific Gravity, Urine 1.012 1.005 - 1.030   pH 6.0 5.0 - 8.0   Glucose, UA NEGATIVE NEGATIVE mg/dL   Hgb urine dipstick NEGATIVE NEGATIVE   Bilirubin Urine NEGATIVE NEGATIVE   Ketones, ur 5 (A) NEGATIVE mg/dL   Protein, ur NEGATIVE NEGATIVE mg/dL   Nitrite NEGATIVE NEGATIVE   Leukocytes,Ua LARGE (A) NEGATIVE   RBC / HPF 0-5 0 - 5 RBC/hpf   WBC, UA 21-50 0 - 5 WBC/hpf   Bacteria, UA RARE (A) NONE SEEN   Squamous Epithelial / HPF 6-10 0 - 5 /HPF   Mucus PRESENT   Wet prep, genital     Status: Abnormal   Collection Time: 06/18/22 11:35 PM  Result Value Ref Range   Yeast Wet Prep HPF POC NONE SEEN NONE SEEN   Trich, Wet Prep NONE SEEN NONE SEEN   Clue Cells Wet Prep HPF POC NONE SEEN NONE SEEN   WBC, Wet Prep HPF POC >=10 (A) <10   Sperm NONE SEEN   CBC with Differential/Platelet     Status: Abnormal   Collection Time: 06/19/22 12:04 AM  Result Value Ref Range   WBC 15.3 (H) 4.0 - 10.5 K/uL   RBC 3.92 3.87 - 5.11 MIL/uL   Hemoglobin 12.0 12.0 - 15.0 g/dL   HCT 09.8 (L) 11.9 - 14.7 %   MCV 86.7 80.0 - 100.0 fL   MCH 30.6 26.0 - 34.0 pg   MCHC 35.3 30.0 - 36.0 g/dL   RDW 82.9 56.2 - 13.0 %   Platelets 215 150 - 400 K/uL   nRBC 0.0 0.0 - 0.2 %   Neutrophils Relative % 84 %   Neutro Abs 13.0 (H) 1.7 - 7.7 K/uL   Lymphocytes Relative 9 %   Lymphs Abs 1.4 0.7 - 4.0 K/uL   Monocytes Relative 5 %   Monocytes Absolute 0.7 0.1 - 1.0 K/uL   Eosinophils Relative 1 %   Eosinophils Absolute  0.1 0.0 - 0.5 K/uL   Basophils Relative 0 %  Basophils Absolute 0.1 0.0 - 0.1 K/uL   Immature Granulocytes 1 %   Abs Immature Granulocytes 0.10 (H) 0.00 - 0.07 K/uL  Comprehensive metabolic panel     Status: Abnormal   Collection Time: 06/19/22 12:04 AM  Result Value Ref Range   Sodium 136 135 - 145 mmol/L   Potassium 3.5 3.5 - 5.1 mmol/L   Chloride 104 98 - 111 mmol/L   CO2 21 (L) 22 - 32 mmol/L   Glucose, Bld 112 (H) 70 - 99 mg/dL   BUN <5 (L) 6 - 20 mg/dL   Creatinine, Ser 1.61 0.44 - 1.00 mg/dL   Calcium 8.9 8.9 - 09.6 mg/dL   Total Protein 6.2 (L) 6.5 - 8.1 g/dL   Albumin 2.9 (L) 3.5 - 5.0 g/dL   AST 13 (L) 15 - 41 U/L   ALT 10 0 - 44 U/L   Alkaline Phosphatase 93 38 - 126 U/L   Total Bilirubin 0.5 0.3 - 1.2 mg/dL   GFR, Estimated >04 >54 mL/min   Anion gap 11 5 - 15   No results found.  MDM PE Labs: CBC/D, CMP, UA, Wet prep, GC/CT EFM Muscle Relaxant Antiemetic Prescription Assessment and Plan  27 year old G2P1001  SIUP at 28.6 weeks Cat I FT Flank Pain  -POC Reviewed -Exam performed and findings discussed. -Informed that vaginal exam suggestive of yeast infection.  Patient endorses some vaginal itching. Plan to treat. -Discussed pain medication and patient agreeable. -Will also give medication for nausea. -Flexeril and phenergan ordered. -Monitor and await results.    Cherre Robins MSN, CNM 06/18/2022, 11:22 PM   Reassessment (12:55 AM) -Results as above. -Informed that will treat for yeast infection based on exam. Patient agreeable. -Rx for Terazol sent to pharmacy on file.  -Patient reports pain has subsided.  -Rx for flexeril sent to pharmacy on file.  -Reviewed possible causes, but informed that provider unsure of what caused pain. However, no significant abnormalities noted in labs. -NST reactive. -Encouraged to call primary office or return to MAU if symptoms worsen or with the onset of new symptoms. -Discharged to home in improved  condition.  Cherre Robins MSN, CNM Advanced Practice Provider, [facility] [employer]

## 2022-06-19 DIAGNOSIS — Z3A29 29 weeks gestation of pregnancy: Secondary | ICD-10-CM | POA: Diagnosis not present

## 2022-06-19 DIAGNOSIS — O26893 Other specified pregnancy related conditions, third trimester: Secondary | ICD-10-CM | POA: Diagnosis not present

## 2022-06-19 DIAGNOSIS — R109 Unspecified abdominal pain: Secondary | ICD-10-CM | POA: Diagnosis not present

## 2022-06-19 LAB — CBC WITH DIFFERENTIAL/PLATELET
Abs Immature Granulocytes: 0.1 10*3/uL — ABNORMAL HIGH (ref 0.00–0.07)
Basophils Absolute: 0.1 10*3/uL (ref 0.0–0.1)
Basophils Relative: 0 %
Eosinophils Absolute: 0.1 10*3/uL (ref 0.0–0.5)
Eosinophils Relative: 1 %
HCT: 34 % — ABNORMAL LOW (ref 36.0–46.0)
Hemoglobin: 12 g/dL (ref 12.0–15.0)
Immature Granulocytes: 1 %
Lymphocytes Relative: 9 %
Lymphs Abs: 1.4 10*3/uL (ref 0.7–4.0)
MCH: 30.6 pg (ref 26.0–34.0)
MCHC: 35.3 g/dL (ref 30.0–36.0)
MCV: 86.7 fL (ref 80.0–100.0)
Monocytes Absolute: 0.7 10*3/uL (ref 0.1–1.0)
Monocytes Relative: 5 %
Neutro Abs: 13 10*3/uL — ABNORMAL HIGH (ref 1.7–7.7)
Neutrophils Relative %: 84 %
Platelets: 215 10*3/uL (ref 150–400)
RBC: 3.92 MIL/uL (ref 3.87–5.11)
RDW: 14.1 % (ref 11.5–15.5)
WBC: 15.3 10*3/uL — ABNORMAL HIGH (ref 4.0–10.5)
nRBC: 0 % (ref 0.0–0.2)

## 2022-06-19 LAB — COMPREHENSIVE METABOLIC PANEL
ALT: 10 U/L (ref 0–44)
AST: 13 U/L — ABNORMAL LOW (ref 15–41)
Albumin: 2.9 g/dL — ABNORMAL LOW (ref 3.5–5.0)
Alkaline Phosphatase: 93 U/L (ref 38–126)
Anion gap: 11 (ref 5–15)
BUN: <5 mg/dL — ABNORMAL LOW (ref 6–20)
CO2: 21 mmol/L — ABNORMAL LOW (ref 22–32)
Calcium: 8.9 mg/dL (ref 8.9–10.3)
Chloride: 104 mmol/L (ref 98–111)
Creatinine, Ser: 0.56 mg/dL (ref 0.44–1.00)
GFR, Estimated: >60 mL/min (ref >60)
Glucose, Bld: 112 mg/dL — ABNORMAL HIGH (ref 70–99)
Potassium: 3.5 mmol/L (ref 3.5–5.1)
Sodium: 136 mmol/L (ref 135–145)
Total Bilirubin: 0.5 mg/dL (ref 0.3–1.2)
Total Protein: 6.2 g/dL — ABNORMAL LOW (ref 6.5–8.1)

## 2022-06-19 MED ORDER — TERCONAZOLE 0.4 % VA CREA: 1.0000 | TOPICAL_CREAM | Freq: Every day | VAGINAL | 0 refills | Status: DC

## 2022-06-19 MED ORDER — CYCLOBENZAPRINE HCL 5 MG PO TABS: 5.0000 mg | ORAL_TABLET | Freq: Three times a day (TID) | ORAL | 0 refills | Status: AC | PRN

## 2022-06-20 LAB — URINE CULTURE

## 2022-06-21 LAB — GC/CHLAMYDIA PROBE AMP (~~LOC~~) NOT AT ARMC
Chlamydia: NEGATIVE
Comment: NEGATIVE
Comment: NORMAL
Neisseria Gonorrhea: NEGATIVE

## 2022-06-25 ENCOUNTER — Encounter: Payer: Self-pay | Admitting: Physical Therapy

## 2022-06-25 ENCOUNTER — Ambulatory Visit: Payer: BC Managed Care – PPO | Admitting: Physical Therapy

## 2022-06-25 DIAGNOSIS — M6281 Muscle weakness (generalized): Secondary | ICD-10-CM

## 2022-06-25 DIAGNOSIS — M62838 Other muscle spasm: Secondary | ICD-10-CM

## 2022-06-25 DIAGNOSIS — R293 Abnormal posture: Secondary | ICD-10-CM

## 2022-06-25 NOTE — Therapy (Signed)
OUTPATIENT PHYSICAL THERAPY FEMALE PELVIC TREATMENT   Patient Name: Catherine Paul MRN: [id number] DOB:01/09/1996, 27 y.o., female Today's Date: 06/25/2022  END OF SESSION:  PT End of Session - 06/25/22 0934     Visit Number 6    Date for PT Re-Evaluation 06/23/22    Authorization Type BCBS    PT Start Time 0930    PT Stop Time 1010    PT Time Calculation (min) 40 min    Activity Tolerance Patient tolerated treatment well    Behavior During Therapy [facility] for tasks assessed/performed                  Past Medical History:  Diagnosis Date   Gallstones    Hypertension    Pregnancy induced hypertension    Past Surgical History:  Procedure Laterality Date   MASS EXCISION Left 09/24/2021   Procedure: EXCISION OF LEFT MASS/ACCESSORY BREAST TISSUE;  Surgeon: Peggye Form, DO;  Location: [facility];  Service: Plastics;  Laterality: Left;  requesting 30 mins   Patient Active Problem List   Diagnosis Date Noted   History of gestational hypertension 02/22/2022   Supervision of other normal pregnancy, antepartum 02/09/2022   Axillary mass, left 07/30/2021   Vacuum-assisted vaginal delivery 03/13/2020   Sciatica without back pain 11/15/2019    PCP: General  REFERRING PROVIDER: Hurshel Party, CNM   REFERRING DIAG:  [phone] (ICD-10-CM) - Pelvic pain affecting pregnancy in second trimester, antepartum  M54.30 (ICD-10-CM) - Sciatica without back pain, unspecified laterality    THERAPY DIAG:  Abnormal posture  Muscle weakness (generalized)  Other muscle spasm  Rationale for Evaluation and Treatment: Rehabilitation  ONSET DATE: 11 weeks into pregnancy  SUBJECTIVE:                                                                                                                                                                                           SUBJECTIVE STATEMENT: Pt had a muscle spasm last Friday after scrubbing  the floor but since then not had any issues Fluid intake: Yes: as much water as I can    PAIN:  Are you having pain? No (it is usually after a lot of activity)   PRECAUTIONS: Other: pregnancy  WEIGHT BEARING RESTRICTIONS: No  FALLS:  Has patient fallen in last 6 months? 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  PATIENT GOALS: be able to have more mobility and be able to walk without pain or without  aggravating the back  PERTINENT HISTORY:  Vaginal delivery with 2nd deg tear; pregnant Sexual abuse: No  BOWEL MOVEMENT: Pain with bowel movement: No Type of bowel movement:Type (Bristol Stool Scale) normal, Frequency normal, and Strain No   URINATION: Pain with urination: No Fully empty bladder: No Stream: Strong Urgency: No Frequency: normal Leakage: Sneezing (not much) Pads: No  INTERCOURSE: Pain with intercourse:  not that I can remember Ability to have vaginal penetration:  Yes:     PREGNANCY: Vaginal deliveries 1 Tearing Yes: 2nd deg   PROLAPSE: None   OBJECTIVE:   DIAGNOSTIC FINDINGS:    PATIENT SURVEYS:    PFIQ-7   COGNITION: Overall cognitive status: Within functional limits for tasks assessed     SENSATION: Light touch: Appears intact Proprioception: Appears intact  MUSCLE LENGTH: Hamstrings: Right 80 deg; Left 70 deg Thomas test:   LUMBAR SPECIAL TESTS:    FUNCTIONAL TESTS:  Single leg stand - Rt trendelenburg  GAIT:  Comments: WFL   POSTURE: anterior pelvic tilt  PELVIC ALIGNMENT:  LUMBARAROM/PROM:  A/PROM A/PROM  eval  Flexion 50%  Extension   Right lateral flexion   Left lateral flexion   Right rotation   Left rotation    (Blank rows = not tested)  LOWER EXTREMITY ROM:  Passive ROM Right eval Left eval  Hip flexion 80% 80%  Hip extension    Hip abduction    Hip  adduction    Hip internal rotation [facility]  WFL   Hip external rotation [facility]   Knee flexion    Knee extension    Ankle dorsiflexion    Ankle plantarflexion    Ankle inversion    Ankle eversion     (Blank rows = not tested)  LOWER EXTREMITY MMT:  MMT Right eval Left eval  Hip flexion 4/5 5  Hip extension    Hip abduction 4/5 5  Hip adduction 4/5 5  Hip internal rotation 5 5  Hip external rotation 5 5  Knee flexion    Knee extension    Ankle dorsiflexion    Ankle plantarflexion    Ankle inversion    Ankle eversion     PALPATION:   General  adductors, hamstrings, lumbar paraspinals tight                External Perineal Exam normal appearance slightly increased redness of the vulva                             Internal Pelvic Floor hard to relax after contracting  Patient confirms identification and approves PT to assess internal pelvic floor and treatment Yes  PELVIC MMT:   MMT eval  Vaginal 4/5 x 5 reps, held 1 for 17 seconds  Internal Anal Sphincter   External Anal Sphincter   Puborectalis   Diastasis Recti   (Blank rows = not tested)        TONE: Normal   PROLAPSE: no  TODAY'S TREATMENT:  DATE: 06/25/22   Exercises: H/s and hip flexor stretch vibration plate Fwd and side lunges on vibration plate  Squat - buttocks to mat 15x Goblet squat 10lb - 2 x 10 Row 7lb pulley - 20x  Lift blue band - standing against the wall opposite - 20x Slider backwards lunge - 15 x each Quadruped opp UE/LE with press - 10x bil Quadruped UE pulling blue band - 10x bil Nustep L3 x 6 min with PT present for status Seated on pball - upright row with 10lb, UE flex with 10lb, blue band horizontal abduction, circles both ways - 20x each Backwards walking with sports cord - 15lb Punch with pulleys 5lb - 20x     PATIENT EDUCATION:  Education  details: Access Code: [email or web address] Person educated: Patient Education method: Explanation, Demonstration, Tactile cues, Verbal cues, and Handouts Education comprehension: verbalized understanding and returned demonstration  HOME EXERCISE PROGRAM: Access Code: [email or web address] URL: [email or web address] Date: 05/14/2022 Prepared by: Dwana Curd  Exercises - Quadruped Circle Weight Shifts  - 1 x daily - 7 x weekly - 3 sets - 10 reps - Cat Cow  - 1 x daily - 7 x weekly - 3 sets - 10 reps - Standing Hamstring Stretch with Step  - 1 x daily - 7 x weekly - 1 sets - 3 reps - 30 sec hold - Standing Hip Flexor Stretch  - 3 x daily - 7 x weekly - 1 sets - 2 reps - 30 sec hold - Standing 'L' Stretch at Counter  - 1 x daily - 7 x weekly - 3 sets - 10 reps - Thoracic Sidebending on Swiss Ball  - 1 x daily - 7 x weekly - 3 sets - 10 reps - Quadruped Alternating Arm Lift  - 1 x daily - 7 x weekly - 2 sets - 10 reps - Rock  - 1 x daily - 7 x weekly - 3 sets - 10 reps - Clamshell  - 1 x daily - 7 x weekly - 3 sets - 10 reps  ASSESSMENT:  CLINICAL IMPRESSION: Today's session focused on final HEP.  Pt has not been having any issues lately and is ind with HEP. Pt recommended to d/c with HEP.  OBJECTIVE IMPAIRMENTS: decreased activity tolerance, decreased coordination, decreased endurance, decreased ROM, decreased strength, increased muscle spasms, impaired flexibility, impaired tone, postural dysfunction, and pain.   ACTIVITY LIMITATIONS: standing and continence  PARTICIPATION LIMITATIONS: community activity and occupation  PERSONAL FACTORS: Time since onset of injury/illness/exacerbation and 1-2 comorbidities: currently [redacted] weeks pregnant, history of 2nd deg tear  are also affecting patient's functional outcome.   REHAB POTENTIAL: Excellent  CLINICAL DECISION MAKING: Evolving/moderate complexity  EVALUATION COMPLEXITY: Moderate   GOALS: Goals reviewed with patient? Yes  SHORT TERM  GOALS: Target date: 04/28/22 Updated 05/28/22   Ind with initial HEP Baseline: Goal status: MET  2.  Pt Reports 20% less pain at work Baseline:  Goal status: MET  LONG TERM GOALS: Target date: 06/23/22 Updated 06/11/22   Pt will be independent with advanced HEP to maintain improvements made throughout therapy  Baseline:  Goal status: MET  2.  Pt will be able to functional actions such as sneezing without leakage  Baseline: no Goal status: MET  3.  Pt will report 80% reduction of pain due to improvements in posture, strength, and muscle length  Baseline: 80% Goal status: MET  4.  Pt will not need to take Tylenol due to ability to manage pain with strengthening  and stretches Baseline: not having to take it Goal status: MET    PLAN:  PT FREQUENCY: 1x/week  PT DURATION: 12 weeks  PLANNED INTERVENTIONS: Therapeutic exercises, Therapeutic activity, Neuromuscular re-education, Balance training, Gait training, Patient/Family education, Self Care, Joint mobilization, Aquatic Therapy, Electrical stimulation, Cryotherapy, Moist heat, Taping, Biofeedback, Manual therapy, and Re-evaluation  PLAN FOR NEXT SESSION: d/c today   Junious Silk, PT 06/25/2022, 9:34 AM  PHYSICAL THERAPY DISCHARGE SUMMARY  Visits from Start of Care: 6  Current functional level related to goals / functional outcomes: See above goals met   Remaining deficits: See above   Education / Equipment: HEP   Patient agrees to discharge. Patient goals were met. Patient is being discharged due to meeting the stated rehab goals.

## 2022-06-29 ENCOUNTER — Encounter: Payer: Self-pay | Admitting: Student

## 2022-06-29 ENCOUNTER — Ambulatory Visit (INDEPENDENT_AMBULATORY_CARE_PROVIDER_SITE_OTHER): Payer: BC Managed Care – PPO | Admitting: Student

## 2022-06-29 VITALS — BP 117/77 | HR 91 | Wt 187.9 lb

## 2022-06-29 DIAGNOSIS — K8020 Calculus of gallbladder without cholecystitis without obstruction: Secondary | ICD-10-CM

## 2022-06-29 DIAGNOSIS — Z8759 Personal history of other complications of pregnancy, childbirth and the puerperium: Secondary | ICD-10-CM

## 2022-06-29 DIAGNOSIS — O26613 Liver and biliary tract disorders in pregnancy, third trimester: Secondary | ICD-10-CM

## 2022-06-29 DIAGNOSIS — Z3480 Encounter for supervision of other normal pregnancy, unspecified trimester: Secondary | ICD-10-CM

## 2022-06-29 DIAGNOSIS — Z3A30 30 weeks gestation of pregnancy: Secondary | ICD-10-CM

## 2022-06-29 NOTE — Progress Notes (Signed)
   PRENATAL VISIT NOTE  Subjective:  Catherine Paul is a 27 y.o. G2P1001 at [redacted]w[redacted]d being seen today for ongoing prenatal care.  She is currently monitored for the following issues for this low-risk pregnancy and has Sciatica without back pain; Vacuum-assisted vaginal delivery; Supervision of other normal pregnancy, antepartum; Axillary mass, left; and History of gestational hypertension on their problem list.  Patient reports no complaints.  Contractions: Irritability. Vag. Bleeding: None.  Movement: Present. Denies leaking of fluid.   The following portions of the patient's history were reviewed and updated as appropriate: allergies, current medications, past family history, past medical history, past social history, past surgical history and problem list.   Objective:   Vitals:   06/29/22 0856  BP: 117/77  Pulse: 91  Weight: 187 lb 14.4 oz (85.2 kg)    Fetal Status: Fetal Heart Rate (bpm): 151   Movement: Present     General:  Alert, oriented and cooperative. Patient is in no acute distress.  Skin: Skin is warm and dry. No rash noted.   Cardiovascular: Normal heart rate noted  Respiratory: Normal respiratory effort, no problems with respiration noted  Abdomen: Soft, gravid, appropriate for gestational age.  Pain/Pressure: Present     Pelvic: Cervical exam deferred        Extremities: Normal range of motion.  Edema: None  Mental Status: Normal mood and affect. Normal behavior. Normal judgment and thought content.   Assessment and Plan:  Pregnancy: G2P1001 at [redacted]w[redacted]d 1. Supervision of other normal pregnancy, antepartum - vigorous and frequent fetal movement  2. [redacted] weeks gestation of pregnancy - follow-up growth scan is scheduled - working with pelvic floor PT   3. History of gestational hypertension - BP stable  - ASA therapy  4. Cholelithiasis affecting pregnancy, antepartum - stable , confirmed plan for general surgery follow-up after pregnancy    Preterm labor  symptoms and general obstetric precautions including but not limited to vaginal bleeding, contractions, leaking of fluid and fetal movement were reviewed in detail with the patient. Please refer to After Visit Summary for other counseling recommendations.   Return in about 2 weeks (around 07/13/2022) for LOB, IN-PERSON.  Future Appointments  Date Time Provider Department Center  07/12/2022  7:45 AM [facility] NURSE [facility] [place]  07/12/2022  8:00 AM [facility] US1 [facility]    Corlis Hove, NP

## 2022-07-12 ENCOUNTER — Ambulatory Visit: Payer: BC Managed Care – PPO | Admitting: *Deleted

## 2022-07-12 ENCOUNTER — Ambulatory Visit: Payer: BC Managed Care – PPO | Attending: Obstetrics and Gynecology

## 2022-07-12 ENCOUNTER — Other Ambulatory Visit: Payer: Self-pay | Admitting: *Deleted

## 2022-07-12 VITALS — BP 117/69 | HR 101

## 2022-07-12 DIAGNOSIS — Z8759 Personal history of other complications of pregnancy, childbirth and the puerperium: Secondary | ICD-10-CM | POA: Diagnosis present

## 2022-07-12 DIAGNOSIS — Z3A32 32 weeks gestation of pregnancy: Secondary | ICD-10-CM

## 2022-07-12 DIAGNOSIS — O3660X0 Maternal care for excessive fetal growth, unspecified trimester, not applicable or unspecified: Secondary | ICD-10-CM

## 2022-07-12 DIAGNOSIS — O09299 Supervision of pregnancy with other poor reproductive or obstetric history, unspecified trimester: Secondary | ICD-10-CM | POA: Diagnosis not present

## 2022-07-12 DIAGNOSIS — O99213 Obesity complicating pregnancy, third trimester: Secondary | ICD-10-CM

## 2022-07-12 DIAGNOSIS — Z3689 Encounter for other specified antenatal screening: Secondary | ICD-10-CM | POA: Insufficient documentation

## 2022-07-12 DIAGNOSIS — O99212 Obesity complicating pregnancy, second trimester: Secondary | ICD-10-CM | POA: Insufficient documentation

## 2022-07-12 DIAGNOSIS — O3663X0 Maternal care for excessive fetal growth, third trimester, not applicable or unspecified: Secondary | ICD-10-CM

## 2022-07-12 DIAGNOSIS — E669 Obesity, unspecified: Secondary | ICD-10-CM | POA: Diagnosis not present

## 2022-07-15 ENCOUNTER — Encounter: Payer: Self-pay | Admitting: Obstetrics and Gynecology

## 2022-07-15 ENCOUNTER — Ambulatory Visit (INDEPENDENT_AMBULATORY_CARE_PROVIDER_SITE_OTHER): Payer: BC Managed Care – PPO | Admitting: Obstetrics and Gynecology

## 2022-07-15 VITALS — BP 127/81 | HR 108 | Wt 187.8 lb

## 2022-07-15 DIAGNOSIS — Z3A32 32 weeks gestation of pregnancy: Secondary | ICD-10-CM

## 2022-07-15 DIAGNOSIS — Z3483 Encounter for supervision of other normal pregnancy, third trimester: Secondary | ICD-10-CM

## 2022-07-15 DIAGNOSIS — Z8759 Personal history of other complications of pregnancy, childbirth and the puerperium: Secondary | ICD-10-CM

## 2022-07-15 DIAGNOSIS — Z3480 Encounter for supervision of other normal pregnancy, unspecified trimester: Secondary | ICD-10-CM

## 2022-07-15 NOTE — Progress Notes (Signed)
Pt reports fetal movement with some uterine irritability. 

## 2022-07-15 NOTE — Progress Notes (Signed)
   LOW-RISK PREGNANCY OFFICE VISIT Patient name: Catherine Paul MRN [id number]  Date of birth: January 06, 1996 Chief Complaint:   Routine Prenatal Visit  History of Present Illness:   Catherine Paul is a 27 y.o. G58P1001 female at [redacted]w[redacted]d with an Estimated Date of Delivery: 09/04/22 being seen today for ongoing management of a low-risk pregnancy.  Today she reports  occasional uterine irritability . Contractions: Irritability. Vag. Bleeding: None.  Movement: Present. denies leaking of fluid. Review of Systems:   Pertinent items are noted in HPI Denies abnormal vaginal discharge w/ itching/odor/irritation, headaches, visual changes, shortness of breath, chest pain, abdominal pain, severe nausea/vomiting, or problems with urination or bowel movements unless otherwise stated above. Pertinent History Reviewed:  Reviewed past medical,surgical, social, obstetrical and family history.  Reviewed problem list, medications and allergies. Physical Assessment:   Vitals:   07/15/22 0935  BP: 127/81  Pulse: (!) 108  Weight: 187 lb 12.8 oz (85.2 kg)  Body mass index is 32.24 kg/m.        Physical Examination:   General appearance: Well appearing, and in no distress  Mental status: Alert, oriented to person, place, and time  Skin: Warm & dry  Cardiovascular: Normal heart rate noted  Respiratory: Normal respiratory effort, no distress  Abdomen: Soft, gravid, nontender  Pelvic: Cervical exam deferred         Extremities: Edema: None  Fetal Status: Fetal Heart Rate (bpm): 154   Movement: Present    No results found for this or any previous visit (from the past 24 hour(s)).  Assessment & Plan:  1) Low-risk pregnancy G2P1001 at [redacted]w[redacted]d with an Estimated Date of Delivery: 09/04/22   2) Supervision of other normal pregnancy, antepartum - Normal LR pregnancy  3) History of gestational hypertension - Continue to monitor BPs and record them in BP app (unable to access Babyscripts) - Bring log  of BPs in app to next appt  4) [redacted] weeks gestation of pregnancy     Meds: No orders of the defined types were placed in this encounter.  Labs/procedures today: none  Plan:  Continue routine obstetrical care   Reviewed: Preterm labor symptoms and general obstetric precautions including but not limited to vaginal bleeding, contractions, leaking of fluid and fetal movement were reviewed in detail with the patient.  All questions were answered. Has home bp cuff. Check bp weekly, let us know if >140/90.   Follow-up: Return in about 2 weeks (around 07/29/2022) for Return OB visit.  No orders of the defined types were placed in this encounter.  Raelyn Mora MSN, CNM 07/15/2022 10:38 AM

## 2022-07-27 ENCOUNTER — Ambulatory Visit (INDEPENDENT_AMBULATORY_CARE_PROVIDER_SITE_OTHER): Payer: BC Managed Care – PPO | Admitting: Certified Nurse Midwife

## 2022-07-27 ENCOUNTER — Encounter: Payer: Self-pay | Admitting: Certified Nurse Midwife

## 2022-07-27 VITALS — BP 124/82 | HR 96 | Wt 190.6 lb

## 2022-07-27 DIAGNOSIS — O26899 Other specified pregnancy related conditions, unspecified trimester: Secondary | ICD-10-CM

## 2022-07-27 DIAGNOSIS — Z8759 Personal history of other complications of pregnancy, childbirth and the puerperium: Secondary | ICD-10-CM

## 2022-07-27 DIAGNOSIS — Z3493 Encounter for supervision of normal pregnancy, unspecified, third trimester: Secondary | ICD-10-CM

## 2022-07-27 DIAGNOSIS — R109 Unspecified abdominal pain: Secondary | ICD-10-CM

## 2022-07-27 DIAGNOSIS — Z3A34 34 weeks gestation of pregnancy: Secondary | ICD-10-CM

## 2022-07-27 DIAGNOSIS — O26893 Other specified pregnancy related conditions, third trimester: Secondary | ICD-10-CM

## 2022-07-27 NOTE — Patient Instructions (Signed)
Things to Try After 37 weeks to Encourage Labor/Get Ready for Labor:    Try the Miles Circuit at www.milescircuit.com daily to improve baby's position and encourage the onset of labor.  Walk a little and rest a little every day.  Change positions often.  Cervical Ripening: May try one or both Red Raspberry Leaf capsules or tea:  two 300mg or 400mg tablets with each meal, 2-3 times a day, or 1-3 cups of tea daily  Potential Side Effects Of Raspberry Leaf:  Most women do not experience any side effects from drinking raspberry leaf tea. However, nausea and loose stools are possible   Evening Primrose Oil capsules: take 1 capsule by mouth and place one capsule in the vagina every night.    Some of the potential side effects:  Upset stomach  Loose stools or diarrhea  Headaches  Nausea  Sex can also help the cervix ripen and encourage labor onset.   

## 2022-07-27 NOTE — Progress Notes (Signed)
Pt presents for ROB visit. Pt c/o intermittent rib pain. Has questions about labor readiness. No other concerns.

## 2022-07-29 NOTE — Progress Notes (Signed)
   PRENATAL VISIT NOTE  Subjective:  Catherine Paul is a 27 y.o. G2P1001 at [redacted]w[redacted]d being seen today for ongoing prenatal care.  She is currently monitored for the following issues for this low-risk pregnancy and has Sciatica without back pain; Vacuum-assisted vaginal delivery; Supervision of other normal pregnancy, antepartum; Axillary mass, left; and History of gestational hypertension on their problem list.  Patient reports  upper abdominal pain on the left side in her rib cage that she believes are feet. Pain relieved with stretching.  .  Contractions: Irritability. Vag. Bleeding: None.  Movement: Present. Denies leaking of fluid.   The following portions of the patient's history were reviewed and updated as appropriate: allergies, current medications, past family history, past medical history, past social history, past surgical history and problem list.   Objective:   Vitals:   07/27/22 1513  BP: 124/82  Pulse: 96  Weight: 190 lb 9.6 oz (86.5 kg)    Fetal Status: Fetal Heart Rate (bpm): 159 Fundal Height: 35 cm Movement: Present     General:  Alert, oriented and cooperative. Patient is in no acute distress.  Skin: Skin is warm and dry. No rash noted.   Cardiovascular: Normal heart rate noted  Respiratory: Normal respiratory effort, no problems with respiration noted  Abdomen: Soft, gravid, appropriate for gestational age.  Pain/Pressure: Present     Pelvic: Cervical exam deferred        Extremities: Normal range of motion.  Edema: Trace  Mental Status: Normal mood and affect. Normal behavior. Normal judgment and thought content.   Assessment and Plan:  Pregnancy: G2P1001 at [redacted]w[redacted]d 1. Encounter for supervision of low-risk pregnancy in third trimester - Patient doing well.  - Reports frequent and vigorous fetal movement.   2. [redacted] weeks gestation of pregnancy - GBS at next visit.  - Fundal height appropriate for gestational age.  - Vertex via Le[place]olds  - Reviewed labor  readiness with patient including the [employer], evening primrose oil, and raspberry leaf tea starting at 36 weeks.   3. History of gestational hypertension - BPs normal today.   4. Abdominal pain affecting pregnancy - Encouraged frequent stretching.   Preterm labor symptoms and general obstetric precautions including but not limited to vaginal bleeding, contractions, leaking of fluid and fetal movement were reviewed in detail with the patient. Please refer to After Visit Summary for other counseling recommendations.   Return in about 2 weeks (around 08/10/2022) for LOB w GBS.  Future Appointments  Date Time Provider Department Center  08/12/2022  9:35 AM Corlis Hove, NP [facility] None  08/12/2022  2:45 PM [facility] NURSE [facility]  08/12/2022  3:00 PM [facility] US1 [facility]  08/20/2022  9:35 AM Raelyn Mora, CNM [facility] None  08/26/2022 10:35 AM Sue Lush, FNP [facility] None  09/02/2022  9:35 AM Raelyn Mora, CNM [facility] None  09/10/2022  9:35 AM Lennart Pall, MD [facility] None    Artisha Capri (Danella Deis) Suzie Portela, MSN, CNM  Center for [facility]  07/29/2022 3:44 AM

## 2022-08-06 ENCOUNTER — Encounter: Payer: Self-pay | Admitting: *Deleted

## 2022-08-12 ENCOUNTER — Ambulatory Visit (INDEPENDENT_AMBULATORY_CARE_PROVIDER_SITE_OTHER): Payer: BC Managed Care – PPO | Admitting: Student

## 2022-08-12 ENCOUNTER — Encounter: Payer: Self-pay | Admitting: Student

## 2022-08-12 ENCOUNTER — Other Ambulatory Visit (HOSPITAL_COMMUNITY)
Admission: RE | Admit: 2022-08-12 | Discharge: 2022-08-12 | Disposition: A | Discharge status: Home / Self Care | Payer: BC Managed Care – PPO | Source: Ambulatory Visit | Attending: Student | Admitting: Student

## 2022-08-12 ENCOUNTER — Encounter: Payer: BC Managed Care – PPO | Admitting: Student

## 2022-08-12 ENCOUNTER — Ambulatory Visit (HOSPITAL_BASED_OUTPATIENT_CLINIC_OR_DEPARTMENT_OTHER): Payer: BC Managed Care – PPO

## 2022-08-12 ENCOUNTER — Ambulatory Visit: Payer: BC Managed Care – PPO

## 2022-08-12 VITALS — BP 126/85 | HR 94 | Wt 194.4 lb

## 2022-08-12 VITALS — BP 125/79 | HR 93

## 2022-08-12 DIAGNOSIS — E669 Obesity, unspecified: Secondary | ICD-10-CM | POA: Diagnosis not present

## 2022-08-12 DIAGNOSIS — O99213 Obesity complicating pregnancy, third trimester: Secondary | ICD-10-CM

## 2022-08-12 DIAGNOSIS — O09293 Supervision of pregnancy with other poor reproductive or obstetric history, third trimester: Secondary | ICD-10-CM | POA: Diagnosis not present

## 2022-08-12 DIAGNOSIS — Z8759 Personal history of other complications of pregnancy, childbirth and the puerperium: Secondary | ICD-10-CM | POA: Insufficient documentation

## 2022-08-12 DIAGNOSIS — K8020 Calculus of gallbladder without cholecystitis without obstruction: Secondary | ICD-10-CM

## 2022-08-12 DIAGNOSIS — Z3A36 36 weeks gestation of pregnancy: Secondary | ICD-10-CM

## 2022-08-12 DIAGNOSIS — O26613 Liver and biliary tract disorders in pregnancy, third trimester: Secondary | ICD-10-CM

## 2022-08-12 DIAGNOSIS — Z3480 Encounter for supervision of other normal pregnancy, unspecified trimester: Secondary | ICD-10-CM

## 2022-08-12 DIAGNOSIS — O3663X0 Maternal care for excessive fetal growth, third trimester, not applicable or unspecified: Secondary | ICD-10-CM

## 2022-08-12 DIAGNOSIS — O3663X1 Maternal care for excessive fetal growth, third trimester, fetus 1: Secondary | ICD-10-CM

## 2022-08-12 NOTE — Progress Notes (Signed)
   PRENATAL VISIT NOTE  Subjective:  Catherine Paul is a 26 y.o. G2P1001 at [redacted]w[redacted]d being seen today for ongoing prenatal care.  She is currently monitored for the following issues for this low-risk pregnancy and has Supervision of other normal pregnancy, antepartum and History of gestational hypertension on their problem list.  Patient reports  pubic pain and pressure .  Contractions: Not present. Vag. Bleeding: None.  Movement: Present. Denies leaking of fluid.   The following portions of the patient's history were reviewed and updated as appropriate: allergies, current medications, past family history, past medical history, past social history, past surgical history and problem list.   Objective:   Vitals:   08/12/22 0943  BP: 126/85  Pulse: 94  Weight: 194 lb 6.4 oz (88.2 kg)    Fetal Status: Fetal Heart Rate (bpm): 154   Movement: Present  Presentation: Vertex  General:  Alert, oriented and cooperative. Patient is in no acute distress.  Skin: Skin is warm and dry. No rash noted.   Cardiovascular: Normal heart rate noted  Respiratory: Normal respiratory effort, no problems with respiration noted  Abdomen: Soft, gravid, appropriate for gestational age.  Pain/Pressure: Present     Pelvic: Cervical exam performed in the presence of a chaperone Dilation: Fingertip Effacement (%): 20, 30 Station: -3  Extremities: Normal range of motion.  Edema: Trace  Mental Status: Normal mood and affect. Normal behavior. Normal judgment and thought content.   Assessment and Plan:  Pregnancy: G2P1001 at [redacted]w[redacted]d 1. Supervision of other normal pregnancy, antepartum - doing well, vigorous and frequent fetal movement   2. [redacted] weeks gestation of pregnancy - Routine follow-up   3. History of gestational hypertension - BP stable this pregnancy   4. Cholelithiasis affecting pregnancy, antepartum - stable, referral planned for general surgery postpartum   5. Large for gestational age fetus  affecting management of mother, antepartum, third trimester, fetus 1 - EFW > 99%tile on previous scan, normal AFI - Repeat growth scheduled today   Preterm labor symptoms and general obstetric precautions including but not limited to vaginal bleeding, contractions, leaking of fluid and fetal movement were reviewed in detail with the patient. Please refer to After Visit Summary for other counseling recommendations.   Return in about 1 week (around 08/19/2022) for LOB, IN-PERSON.  Future Appointments  Date Time Provider Department Center  08/12/2022  2:45 PM [facility] NURSE [facility]  08/12/2022  3:00 PM [facility] US1 [facility]  08/20/2022  9:35 AM Raelyn Mora, CNM [facility] None  08/26/2022 10:35 AM Sue Lush, FNP [facility] None  09/02/2022  9:35 AM Raelyn Mora, CNM [facility] None  09/10/2022  9:35 AM Lennart Pall, MD [facility] None    Corlis Hove, NP

## 2022-08-12 NOTE — Progress Notes (Signed)
Pt presents for ROB visit. Pt c/o pubic pain and pressure.

## 2022-08-16 LAB — CERVICOVAGINAL ANCILLARY ONLY
Bacterial Vaginitis (gardnerella): POSITIVE — AB
Candida Glabrata: NEGATIVE
Candida Vaginitis: POSITIVE — AB
Chlamydia: NEGATIVE
Comment: NEGATIVE
Comment: NEGATIVE
Comment: NEGATIVE
Comment: NEGATIVE
Comment: NEGATIVE
Comment: NORMAL
Neisseria Gonorrhea: NEGATIVE
Trichomonas: NEGATIVE

## 2022-08-16 LAB — CULTURE, BETA STREP (GROUP B ONLY): Strep Gp B Culture: NEGATIVE

## 2022-08-17 ENCOUNTER — Encounter (HOSPITAL_COMMUNITY): Payer: Self-pay | Admitting: Obstetrics and Gynecology

## 2022-08-17 ENCOUNTER — Inpatient Hospital Stay (HOSPITAL_COMMUNITY)
Admission: AD | Admit: 2022-08-17 | Discharge: 2022-08-17 | Disposition: A | Discharge status: Home / Self Care | Payer: BC Managed Care – PPO | Attending: Obstetrics and Gynecology | Admitting: Obstetrics and Gynecology

## 2022-08-17 DIAGNOSIS — O98813 Other maternal infectious and parasitic diseases complicating pregnancy, third trimester: Secondary | ICD-10-CM | POA: Insufficient documentation

## 2022-08-17 DIAGNOSIS — Z3A37 37 weeks gestation of pregnancy: Secondary | ICD-10-CM

## 2022-08-17 DIAGNOSIS — O479 False labor, unspecified: Secondary | ICD-10-CM

## 2022-08-17 DIAGNOSIS — O471 False labor at or after 37 completed weeks of gestation: Secondary | ICD-10-CM | POA: Insufficient documentation

## 2022-08-17 DIAGNOSIS — Z3493 Encounter for supervision of normal pregnancy, unspecified, third trimester: Secondary | ICD-10-CM

## 2022-08-17 DIAGNOSIS — B379 Candidiasis, unspecified: Secondary | ICD-10-CM | POA: Insufficient documentation

## 2022-08-17 DIAGNOSIS — Z0371 Encounter for suspected problem with amniotic cavity and membrane ruled out: Secondary | ICD-10-CM | POA: Insufficient documentation

## 2022-08-17 LAB — WET PREP, GENITAL
Clue Cells Wet Prep HPF POC: NONE SEEN
Sperm: NONE SEEN
Trich, Wet Prep: NONE SEEN
WBC, Wet Prep HPF POC: >=10 — AB (ref <10)

## 2022-08-17 LAB — RUPTURE OF MEMBRANE (ROM)PLUS: Rom Plus: NEGATIVE

## 2022-08-17 LAB — POCT FERN TEST

## 2022-08-17 MED ORDER — TERCONAZOLE 0.4 % VA CREA: 1.0000 | TOPICAL_CREAM | Freq: Every day | VAGINAL | 0 refills | Status: AC

## 2022-08-17 NOTE — MAU Provider Note (Signed)
Event Date/Time   First Provider Initiated Contact with Patient 08/17/22 0603       S: Ms. Catherine Paul is a 27 y.o. G2P1001 at [redacted]w[redacted]d  who presents to MAU today complaining of leaking of fluid since this morning. She denies vaginal bleeding. She denies contractions. She reports normal fetal movement.    O: BP 129/85 (BP Location: Left Arm)   Pulse 89   Resp 17   Ht 5\' 4"  (1.626 m)   Wt 88 kg   LMP 11/28/2021   SpO2 99%   BMI 33.32 kg/m  GENERAL: Well-developed, well-nourished female in no acute distress.  HEAD: Normocephalic, atraumatic.  CHEST: Normal effort of breathing, regular heart rate ABDOMEN: Soft, nontender, gravid PELVIC: Normal external female genitalia. Vagina is pink and rugated. Cervix with normal contour, no lesions. Copious amounts of yellow/green vaginal discharge adhered to vaginal walls and cervix.    Negative for pooling.   Cervical exam:  Deferred      Fetal Monitoring: Baseline: 145bp, Variability: moderate Accelerations: 15x15 present  Decelerations: absent Contractions: none   Results for orders placed or performed during the hospital encounter of 08/17/22 (from the past 24 hour(s))  POCT fern test     Status: Normal   Collection Time: 08/17/22  4:45 AM  Result Value Ref Range   POCT Fern Test     Negative    Rupture of Membrane (ROM) Plus     Status: None   Collection Time: 08/17/22  4:56 AM  Result Value Ref Range   Rom Plus NEGATIVE      A: SIUP at [redacted]w[redacted]d  - Negative fern x3  - Negative ROM +  - Low suspicion for SROM  Membranes intact - Suspicion for yeast  - plan for discharge.   P: 1. Yeast infection   2. Intact amniotic membranes during pregnancy in third trimester   3. False labor   4. [redacted] weeks gestation of pregnancy    - Rx for terazole sent to outpatient pharmacy  - Signs and symptoms of labor reviewed  - FHT appropriate for gestational age at time of discharge.  - Patient discharged home in stable condition  and may return to MAU as needed .  Carlynn Herald, CNM 08/17/2022 6:04 AM

## 2022-08-20 ENCOUNTER — Ambulatory Visit (INDEPENDENT_AMBULATORY_CARE_PROVIDER_SITE_OTHER): Payer: BC Managed Care – PPO | Admitting: Obstetrics and Gynecology

## 2022-08-20 VITALS — BP 126/87 | HR 83 | Wt 196.2 lb

## 2022-08-20 DIAGNOSIS — B379 Candidiasis, unspecified: Secondary | ICD-10-CM

## 2022-08-20 DIAGNOSIS — O3663X1 Maternal care for excessive fetal growth, third trimester, fetus 1: Secondary | ICD-10-CM

## 2022-08-20 DIAGNOSIS — Z3480 Encounter for supervision of other normal pregnancy, unspecified trimester: Secondary | ICD-10-CM

## 2022-08-20 NOTE — Progress Notes (Signed)
ROB: Pt stating that her right hand feels swollen this morning.  Pt wants cervix checked and missed membrane sweep ?

## 2022-08-20 NOTE — Progress Notes (Signed)
   LOW-RISK PREGNANCY OFFICE VISIT Patient name: Catherine Paul MRN [id number]  Date of birth: 09/27/1995 Chief Complaint:   Routine Prenatal Visit  History of Present Illness:   Catherine Paul is a 27 y.o. G9P1001 female at [redacted]w[redacted]d with an Estimated Date of Delivery: 09/04/22 being seen today for ongoing management of a low-risk pregnancy.  Today she reports occasional contractions. She states that at her last U/S "they told me the baby was 8 lbs 14 oz and they recommend induction at 39 weeks. I think I agree. I would like to be induced at 39 weeks." Contractions: Irregular. Vag. Bleeding: None.  Movement: Present. denies leaking of fluid. Review of Systems:   Pertinent items are noted in HPI Denies abnormal vaginal discharge w/ itching/odor/irritation, headaches, visual changes, shortness of breath, chest pain, abdominal pain, severe nausea/vomiting, or problems with urination or bowel movements unless otherwise stated above. Pertinent History Reviewed:  Reviewed past medical,surgical, social, obstetrical and family history.  Reviewed problem list, medications and allergies. Physical Assessment:   Vitals:   08/20/22 0941  BP: 126/87  Pulse: 83  Weight: 196 lb 3.2 oz (89 kg)  Body mass index is 33.68 kg/m.        Physical Examination:   General appearance: Well appearing, and in no distress  Mental status: Alert, oriented to person, place, and time  Skin: Warm & dry  Cardiovascular: Normal heart rate noted  Respiratory: Normal respiratory effort, no distress  Abdomen: Soft, gravid, nontender  Pelvic: Cervical exam performed  Dilation: 2.5 Effacement (%): 70 Station: -3  Extremities: Edema: Trace  Fetal Status: Fetal Heart Rate (bpm): 151 Fundal Height: 40 cm Movement: Present Presentation: Vertex  No results found for this or any previous visit (from the past 24 hour(s)).  Assessment & Plan:  1) Low-risk pregnancy G2P1001 at [redacted]w[redacted]d with an Estimated Date of  Delivery: 09/04/22   2) Supervision of other normal pregnancy, antepartum - Discussed that eIOL cannot be scheduled before 40 wks and would have to be scheduled at least 2 weeks in advanced; which would be at her nv. - Methods of inductions discussed - would recommend Pitocin and AROM for her - Labor precautions discussed.   2. Large for gestational age fetus affecting management of mother, antepartum, third trimester, fetus 1  3. Yeast infection - Currently being treated    Meds: No orders of the defined types were placed in this encounter.  Labs/procedures today: cervical exam  Plan:  Continue routine obstetrical care   Reviewed: Term labor symptoms and general obstetric precautions including but not limited to vaginal bleeding, contractions, leaking of fluid and fetal movement were reviewed in detail with the patient.  All questions were answered. Has home bp cuff. Check bp weekly, let us know if >140/90.   Follow-up: Return in about 1 week (around 08/27/2022) for Return OB visit.  No orders of the defined types were placed in this encounter.  Raelyn Mora MSN, CNM 08/20/2022 10:37 AM

## 2022-08-24 ENCOUNTER — Inpatient Hospital Stay (HOSPITAL_COMMUNITY)
Admission: AD | Admit: 2022-08-24 | Discharge: 2022-08-24 | Disposition: A | Discharge status: Home / Self Care | Payer: BC Managed Care – PPO | Attending: Obstetrics and Gynecology | Admitting: Obstetrics and Gynecology

## 2022-08-24 ENCOUNTER — Encounter (HOSPITAL_COMMUNITY): Payer: Self-pay | Admitting: Obstetrics & Gynecology

## 2022-08-24 ENCOUNTER — Telehealth: Payer: Self-pay

## 2022-08-24 DIAGNOSIS — O368130 Decreased fetal movements, third trimester, not applicable or unspecified: Secondary | ICD-10-CM | POA: Insufficient documentation

## 2022-08-24 DIAGNOSIS — O471 False labor at or after 37 completed weeks of gestation: Secondary | ICD-10-CM | POA: Insufficient documentation

## 2022-08-24 DIAGNOSIS — Z3A38 38 weeks gestation of pregnancy: Secondary | ICD-10-CM | POA: Insufficient documentation

## 2022-08-24 DIAGNOSIS — O133 Gestational [pregnancy-induced] hypertension without significant proteinuria, third trimester: Secondary | ICD-10-CM | POA: Insufficient documentation

## 2022-08-24 DIAGNOSIS — O26893 Other specified pregnancy related conditions, third trimester: Secondary | ICD-10-CM | POA: Diagnosis not present

## 2022-08-24 NOTE — MAU Provider Note (Signed)
Obstetric Attending MAU Note  Chief Complaint:  Contractions and Decreased Fetal Movement   Event Date/Time   First Provider Initiated Contact with Patient 08/24/22 1931     HPI: Catherine Paul is a 27 y.o. G2P1001 at [redacted]w[redacted]d who presents to maternity admissions reporting contractions with decreased fetal movement since around 5 pm. Last checked in the office and 2.5 cm. Denies  leakage of fluid or vaginal bleeding.   Pregnancy Course: Receives care at [facility] Patient Active Problem List   Diagnosis Date Noted   History of gestational hypertension 02/22/2022   Supervision of other normal pregnancy, antepartum 02/09/2022    Past Medical History:  Diagnosis Date   Axillary mass, left 07/30/2021   Formatting of this note might be different from the original. S/p surgical excision. Benign mass. Aug 2023 - [facility] Last Assessment & Plan: Formatting of this note might be different from the original. Chronic issue.  No acute changes in size or symptoms.  Patient desiring further evaluation.  Recommend referral to plastic surgery.  Patient had ultrasound performed in 2017.  Given no acute    Gallstones    Hypertension    Pregnancy induced hypertension    Sciatica without back pain 11/15/2019   Vacuum-assisted vaginal delivery 03/13/2020   Vaginal bleeding and fetal bradycardia. C/f partial abruption.    OB History  Gravida Para Term Preterm AB Living  2 1 1  0 0 1  SAB IAB Ectopic Multiple Live Births  0 0 0 0 1    # Outcome Date GA Lbr Len/2nd Weight Sex Type Anes PTL Lv  2 Current           1 Term 03/13/20 [redacted]w[redacted]d 03:26 / 00:31 3160 g M Vag-Vacuum EPI  LIV     Birth Comments: wnl    Past Surgical History:  Procedure Laterality Date   MASS EXCISION Left 09/24/2021   Procedure: EXCISION OF LEFT MASS/ACCESSORY BREAST TISSUE;  Surgeon: Peggye Form, DO;  Location: [facility];  Service: Plastics;  Laterality: Left;  requesting 30 mins    Family  History: Family History  Problem Relation Age of Onset   Lumbar disc disease Paternal Aunt    Diabetes Maternal Grandmother    Hypertension Maternal Grandmother    Cancer Maternal Grandfather    Colon cancer Maternal Grandfather    Hypertension Maternal Grandfather    Stroke Maternal Grandfather    Hypertension Paternal Grandmother    Hypertension Paternal Grandfather     Social History: Social History   Tobacco Use   Smoking status: Never   Smokeless tobacco: Never  Vaping Use   Vaping status: Never Used  Substance Use Topics   Alcohol use: Not Currently    Comment: 1-3 week   Drug use: Never    Allergies: No Known Allergies  Medications Prior to Admission  Medication Sig Dispense Refill Last Dose   aspirin EC 81 MG tablet Take 1 tablet (81 mg total) by mouth daily. Take after 12 weeks for prevention of preeclampsia later in pregnancy 300 tablet 2 08/23/2022   Prenat-Fe Poly-Methfol-FA-DHA (VITAFOL ULTRA) 29-0.6-0.4-200 MG CAPS Take 1 capsule by mouth daily. 30 capsule 12 08/23/2022   terconazole (TERAZOL 7) 0.4 % vaginal cream Place 1 applicator vaginally at bedtime. Use for seven days 45 g 0 08/23/2022   acetaminophen (TYLENOL) 500 MG tablet Take 1,000 mg by mouth every 6 (six) hours as needed. (Patient not taking: Reported on 08/20/2022)      cyclobenzaprine (FLEXERIL)  5 MG tablet Take 1 tablet (5 mg total) by mouth 3 (three) times daily as needed for muscle spasms. 20 tablet 0     ROS: Pertinent findings in history of present illness.  Physical Exam  Blood pressure 131/80, pulse 95, temperature 97.9 F (36.6 C), temperature source Oral, resp. rate 18, height 5\' 4"  (1.626 m), weight 88.4 kg, last menstrual period 11/28/2021, SpO2 99%. CONSTITUTIONAL: Well-developed, well-nourished female in no acute distress.  HENT:  Normocephalic, atraumatic, External right and left ear normal. Oropharynx is clear and moist EYES: Conjunctivae and EOM are normal.  No scleral icterus.   NECK: Normal range of motion, supple, no masses SKIN: Skin is warm and dry. No rash noted. Not diaphoretic. No erythema. No pallor. NEUROLGIC: Alert and oriented to person, place, and time.  CARDIOVASCULAR: Normal heart rate noted, regular rhythm RESPIRATORY: Effort and breath sounds normal, no problems with respiration noted ABDOMEN: Soft, nontender, nondistended, gravid appropriate for gestational age MUSCULOSKELETAL: Normal range of motion. No edema and no tenderness. 2+ distal pulses. CERVICAL EXAM: Dilation: 2.5 Effacement (%): 70 Station: Ballotable Presentation: Vertex Exam by:: [employer], RN  FHT:  Baseline 150 , moderate variability, accelerations present, no decelerations Contractions: q 6 mins   Labs: No results found for this or any previous visit (from the past 24 hour(s)).  Imaging:  [place] MFM OB FOLLOW UP  Result Date: 08/12/2022 ----------------------------------------------------------------------  OBSTETRICS REPORT                       (Signed Final 08/12/2022 03:56 pm) ---------------------------------------------------------------------- Patient Info  ID #:       [id number]                          D.O.B.:  19-Aug-1995 (26 yrs)  Name:       Catherine Ridgel-                 Visit Date: 08/12/2022 03:02 pm              BENNETT ---------------------------------------------------------------------- Performed By  Attending:        Braxton Feathers DO       Ref. Address:     Faculty  Performed By:     Palma Holter         [facility]  Referred  By:      PEGGY                    CONSTANT MD ---------------------------------------------------------------------- Orders  #  Description                           Code        Ordered By  1  [place] MFM OB  FOLLOW UP                   [id number]    YU FANG ----------------------------------------------------------------------  #  Order #                     Accession #                Episode #  1  [id number]                   [id number]                 [id number] ---------------------------------------------------------------------- Indications  Obesity complicating pregnancy, third          O99.213  trimester ( BMI 31)  Large for gestational age fetus affecting      O36.60X0  management of mother  Poor obstetric history: Gestational HTN        O09.299  [redacted] weeks gestation of pregnancy                Z3A.36  LR NIPS/Neg Horizon ---------------------------------------------------------------------- Vital Signs  BP:          125/79 ---------------------------------------------------------------------- Fetal Evaluation  Num Of Fetuses:         1  Fetal Heart Rate(bpm):  137  Cardiac Activity:       Observed  Presentation:           Cephalic  Placenta:               Anterior Fundal  P. Cord Insertion:      Visualized, central  Amniotic Fluid  AFI FV:      Within normal limits  AFI Sum(cm)     %Tile       Largest Pocket(cm)  19.39           74          5.59  RUQ(cm)       RLQ(cm)       LUQ(cm)        LLQ(cm)  3.82          5.59          4.61           5.37 ---------------------------------------------------------------------- Biometry  BPD:      91.7  mm     G. Age:  37w 2d         76  %    CI:        75.25   %    70 - 86                                                          FL/HC:      21.9   %    20.8 - 22.6  HC:      335.3  mm     G. Age:  38w 3d         64  %    HC/AC:      0.94        0.92 - 1.05  AC:      356.3  mm     G. Age:  39w 4d       > 99  %    FL/BPD:     79.9   %    71 - 87  FL:       73.3  mm     G. Age:  37w 4d         69  %    FL/AC:      20.6   %    20 - 24  LV:        6.8  mm  Est. FW:    3561  gm    7 lb 14 oz      94  % ---------------------------------------------------------------------- OB History   Blood Type:   O+  Maternal Racial/Ethnic Group:   White  Gravidity:    2         Term:   1        Prem:   0        SAB:   0  TOP:          0       Ectopic:  0        Living: 1 ---------------------------------------------------------------------- Gestational Age  LMP:           36w 5d        Date:  11/28/21                 EDD:   09/04/22  U/S Today:     38w 2d                                        EDD:   08/24/22  Best:          36w 5d     Det. By:  LMP  (11/28/21)          EDD:   09/04/22 ---------------------------------------------------------------------- Anatomy  Cranium:               Appears normal         Aortic Arch:            Previously seen  Cavum:                 Appears normal         Ductal Arch:            Previously seen  Ventricles:            Appears normal         Diaphragm:              Appears normal  Choroid Plexus:        Previously seen        Stomach:                Appears normal, left  sided  Cerebellum:            Previously seen        Abdomen:                Appears normal  Posterior Fossa:       Previously seen        Abdominal Wall:         Previously seen  Nuchal Fold:           Previously seen        Cord Vessels:           Appears normal (3                                                                        vessel cord)  Face:                  Orbits and profile     Kidneys:                Appear normal                         previously seen  Lips:                  Previously seen        Bladder:                Appears normal  Thoracic:              Appears normal         Spine:                  Previously seen  Heart:                 Appears normal         Upper Extremities:      Previously seen                         (4CH, axis, and                         situs)  RVOT:                  Previously seen        Lower Extremities:      Previously seen  LVOT:                  Previously seen  Other:  Female  gender previously seen. Nasal bone, lenses, maxilla,          mandible, falx, heels/feet, hands, VC, 3VV and 3VTV previously          visualized. ---------------------------------------------------------------------- Cervix Uterus Adnexa  Cervix  Not visualized (advanced GA >24wks) ---------------------------------------------------------------------- Comments   The patient is here for ultrasound at 36w 5d. EDD:  09/04/2022 dated by LMP  (11/28/21).  She has no further  concerns today.  Sonographic findings  Single intrauterine pregnancy.  Fetal cardiac activity:  Observed and appears normal.  Presentation: Cephalic.  Interval fetal anatomy appears normal.  Fetal biometry shows the  estimated fetal weight at the 94  percentile and the abdominal circumference at the 99th  percentile.  Amniotic fluid volume: Within normal limits. MVP: 5.59 cm.  Placenta: Anterior Fundal.  Recommendations  - Membrane sweep next week  - Delivery around 39-40w due to LGA fetus ----------------------------------------------------------------------                  Braxton Feathers, DO Electronically Signed Final Report   08/12/2022 03:56 pm ----------------------------------------------------------------------    MAU Course: PO hydration Reassuring FHR, with good fetal movement which patient is aware of prior to DC  Assessment: 1. False labor after 37 completed weeks of gestation   2. [redacted] weeks gestation of pregnancy   3. Decreased fetal movements in third trimester, single or unspecified fetus     Plan: Discharge home Labor precautions and fetal kick counts reviewed Follow up with OB provider   Follow-up Information     [facility] Follow up.   Specialty: Obstetrics and Gynecology Why: keep next scheduled appointment Contact information: [address], [address] [place] [address] [phone]                Allergies as of 08/24/2022   No Known  Allergies      Medication List     TAKE these medications    acetaminophen 500 MG tablet Commonly known as: TYLENOL Take 1,000 mg by mouth every 6 (six) hours as needed.   aspirin EC 81 MG tablet Take 1 tablet (81 mg total) by mouth daily. Take after 12 weeks for prevention of preeclampsia later in pregnancy   cyclobenzaprine 5 MG tablet Commonly known as: FLEXERIL Take 1 tablet (5 mg total) by mouth 3 (three) times daily as needed for muscle spasms.   terconazole 0.4 % vaginal cream Commonly known as: TERAZOL 7 Place 1 applicator vaginally at bedtime. Use for seven days   Vitafol Ultra 29-0.6-0.4-200 MG Caps Take 1 capsule by mouth daily.        Reva Bores, MD 08/24/2022 8:00 PM

## 2022-08-24 NOTE — Telephone Encounter (Signed)
Received message from after hours nurse regarding pt complaints of contractions and elevated blood pressure readings.  Pt states blood pressure is 119/91 and 122/89, denies headache or visual changes. Pt advised to continue monitoring the symptoms throughout the day and increasing fluid intake.

## 2022-08-24 NOTE — MAU Note (Signed)
.  Catherine Paul is a 27 y.o. at [redacted]w[redacted]d here in MAU reporting: CTX's and constant rectal pressure for the past hour. She reports her CTX's are every 8 minutes. Denies VB or LOF. DFM since 1740 today.   Hx HTN.   Onset of complaint: 1740 Pain score: 3/10 lower abdomen  FHT: 155 initial external Lab orders placed from triage: MAU Labor Eval

## 2022-08-25 ENCOUNTER — Inpatient Hospital Stay (HOSPITAL_COMMUNITY)
Admission: AD | Admit: 2022-08-25 | Discharge: 2022-08-27 | DRG: 807 | Disposition: A | Discharge status: Home / Self Care | Payer: BC Managed Care – PPO | Attending: Obstetrics and Gynecology | Admitting: Obstetrics and Gynecology

## 2022-08-25 ENCOUNTER — Inpatient Hospital Stay (HOSPITAL_COMMUNITY)
Admission: AD | Admit: 2022-08-25 | Discharge: 2022-08-25 | Disposition: A | Discharge status: Home / Self Care | Payer: BC Managed Care – PPO | Source: Home / Self Care | Attending: Obstetrics & Gynecology | Admitting: Obstetrics & Gynecology

## 2022-08-25 ENCOUNTER — Encounter (HOSPITAL_COMMUNITY): Payer: Self-pay | Admitting: Obstetrics & Gynecology

## 2022-08-25 ENCOUNTER — Encounter (HOSPITAL_COMMUNITY): Payer: Self-pay | Admitting: Obstetrics and Gynecology

## 2022-08-25 DIAGNOSIS — Z3480 Encounter for supervision of other normal pregnancy, unspecified trimester: Secondary | ICD-10-CM

## 2022-08-25 DIAGNOSIS — O471 False labor at or after 37 completed weeks of gestation: Secondary | ICD-10-CM | POA: Insufficient documentation

## 2022-08-25 DIAGNOSIS — Z8759 Personal history of other complications of pregnancy, childbirth and the puerperium: Secondary | ICD-10-CM

## 2022-08-25 DIAGNOSIS — Z3A38 38 weeks gestation of pregnancy: Secondary | ICD-10-CM | POA: Insufficient documentation

## 2022-08-25 DIAGNOSIS — O479 False labor, unspecified: Secondary | ICD-10-CM

## 2022-08-25 LAB — TYPE AND SCREEN
ABO/RH(D): O POS
Antibody Screen: NEGATIVE

## 2022-08-25 LAB — CBC
HCT: 37.9 % (ref 36.0–46.0)
Hemoglobin: 12.9 g/dL (ref 12.0–15.0)
MCH: 29.9 pg (ref 26.0–34.0)
MCHC: 34 g/dL (ref 30.0–36.0)
MCV: 87.9 fL (ref 80.0–100.0)
Platelets: 257 10*3/uL (ref 150–400)
RBC: 4.31 MIL/uL (ref 3.87–5.11)
RDW: 14 % (ref 11.5–15.5)
WBC: 15.2 10*3/uL — ABNORMAL HIGH (ref 4.0–10.5)
nRBC: 0 % (ref 0.0–0.2)

## 2022-08-25 MED ORDER — FENTANYL CITRATE (PF) 100 MCG/2ML IJ SOLN
50.0000 ug | INTRAMUSCULAR | Status: DC | PRN
  Administered 2022-08-25: 50 ug via INTRAVENOUS
  Administered 2022-08-26: 100 ug via INTRAVENOUS
  Filled 2022-08-25 (×3): qty 2

## 2022-08-25 NOTE — MAU Note (Signed)
.  Catherine Paul is a 27 y.o. at [redacted]w[redacted]d here in MAU reporting: contractions every 4 minutes, reports they are more intense than before. Patient was discharged from MAU earlier today.   Denies VB, LOF and reports +FM.    Onset of complaint: 1830 Pain score: 7/10 Vitals:   08/25/22 2135 08/25/22 2136  BP:  134/83  Pulse:  89  Resp:  18  Temp:  98.4 F (36.9 C)  SpO2: 100% 100%     FHT:155 Lab orders placed from triage:

## 2022-08-25 NOTE — H&P (Signed)
Catherine Paul is a 27 y.o. G93P1001 female at [redacted]w[redacted]d by LMP c/w [redacted]w[redacted]d u/s presenting with SOL.   Reports active fetal movement, contractions: regular, every 4 minutes, vaginal bleeding: none, membranes: intact.  Initiated prenatal care at [facility] at 12.2 wks.   Most recent u/s : [redacted]w[redacted]d, EFW 7+14 (94%), AFI 19cm, cephalic, ant fundal placenta.   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 S/p surgical excision. Benign mass. Aug 2023 - [facility] Last Assessment & Plan: Formatting of this note might be different from the original. Chronic issue.  No acute changes in size or symptoms.  Patient desiring further evaluation.  Recommend referral to plastic surgery.  Patient had ultrasound performed in 2017.  Given no acute    Gallstones    Hypertension    Pregnancy induced hypertension    Sciatica without back pain 11/15/2019   Vacuum-assisted vaginal delivery 03/13/2020   Vaginal bleeding and fetal bradycardia. C/f partial abruption.    Past Surgical History: Past Surgical History:  Procedure Laterality Date   MASS EXCISION Left 09/24/2021   Procedure: EXCISION OF LEFT MASS/ACCESSORY BREAST TISSUE;  Surgeon: Peggye Form, DO;  Location: [facility];  Service: Plastics;  Laterality: Left;  requesting 30 mins    Obstetrical History: OB History     Gravida  2   Para  1   Term  1   Preterm  0   AB  0   Living  1      SAB  0   IAB  0   Ectopic  0   Multiple  0   Live Births  1           Social History: Social History   Socioeconomic History   Marital status: Married    Spouse name: Not on file   Number of children: Not on file   Years of education: Not on file    Highest education level: Not on file  Occupational History   Not on file  Tobacco Use   Smoking status: Never   Smokeless tobacco: Never  Vaping Use   Vaping status: Never Used  Substance and Sexual Activity   Alcohol use: Not Currently    Comment: 1-3 week   Drug use: Never   Sexual activity: Not Currently    Partners: Male    Birth control/protection: None  Other Topics Concern   Not on file  Social History Narrative   Not on file   Social Determinants of Health   Financial Resource Strain: Not on file  Food Insecurity: No Food Insecurity (07/30/2021)   Received from [facility], [facility]   Hunger Vital Sign    Worried About Running Out of Food in the Last Year: Never true    Ran Out of Food in the Last Year: Never true  Transportation Needs: Not on file  Physical Activity: Not on file  Stress: Not on file  Social Connections: Moderately Integrated (09/28/2021)   Received from [facility], [facility]   Social Network    How would you rate your social network (family, work, friends)?: Adequate participation with social networks    Family History: Family History  Problem Relation Age of Onset   Lumbar disc  disease Paternal Aunt    Diabetes Maternal Grandmother    Hypertension Maternal Grandmother    Cancer Maternal Grandfather    Colon cancer Maternal Grandfather    Hypertension Maternal Grandfather    Stroke Maternal Grandfather    Hypertension Paternal Grandmother    Hypertension Paternal Grandfather     Allergies: No Known Allergies  Medications Prior to Admission  Medication Sig Dispense Refill Last Dose   aspirin EC 81 MG tablet Take 1 tablet (81 mg total) by mouth daily. Take after 12 weeks for prevention of preeclampsia later in pregnancy 300 tablet 2 08/25/2022   Prenat-Fe Poly-Methfol-FA-DHA (VITAFOL ULTRA) 29-0.6-0.4-200 MG CAPS Take 1 capsule by mouth daily. 30 capsule 12 08/25/2022   acetaminophen (TYLENOL) 500 MG tablet Take 1,000 mg by  mouth every 6 (six) hours as needed. (Patient not taking: Reported on 08/20/2022)      cyclobenzaprine (FLEXERIL) 5 MG tablet Take 1 tablet (5 mg total) by mouth 3 (three) times daily as needed for muscle spasms. 20 tablet 0    terconazole (TERAZOL 7) 0.4 % vaginal cream Place 1 applicator vaginally at bedtime. Use for seven days 45 g 0     Review of Systems  Pertinent pos/neg as indicated in HPI  Blood pressure 127/87, pulse 100, temperature 98.4 F (36.9 C), temperature source Oral, resp. rate 18, last menstrual period 11/28/2021, SpO2 99%. General appearance: {general exam:16600} Lungs: clear to auscultation bilaterally Heart: regular rate and rhythm Abdomen: gravid, soft, non-tender, EFW by Leopold's approximately 8-8.5 lbs Extremities: tr edema  Fetal monitoring: FHR: 150s bpm, variability: moderate,  Accelerations: Present,  decelerations:  Absent Uterine activity: Frequency: Every 3 minutes Dilation: 5 Effacement (%): 70 Station: -1 Exam by:: A Twitty RN Presentation: cephalic   Prenatal labs: ABO, Rh: --/--/O POS (07/24 2204) Antibody: NEG (07/24 2204) Rubella: 4.06 (01/09 1521) RPR: Non Reactive (05/13 0810)  HBsAg: Negative (01/09 1521)  HIV: Non Reactive (05/13 0810)  GBS: Negative/-- (07/11 1214)  2hr GTT: 77/161/116  Prenatal Transfer Tool  Maternal Diabetes: No Genetic Screening: Normal Maternal Ultrasounds/Referrals: Normal Fetal Ultrasounds or other Referrals:  None Maternal Substance Abuse:  No Significant Maternal Medications:  None Significant Maternal Lab Results: Group B Strep negative  Results for orders placed or performed during the hospital encounter of 08/25/22 (from the past 24 hour(s))  CBC   Collection Time: 08/25/22 10:04 PM  Result Value Ref Range   WBC 15.2 (H) 4.0 - 10.5 K/uL   RBC 4.31 3.87 - 5.11 MIL/uL   Hemoglobin 12.9 12.0 - 15.0 g/dL   HCT 16.1 09.6 - 04.5 %   MCV 87.9 80.0 - 100.0 fL   MCH 29.9 26.0 - 34.0 pg   MCHC 34.0  30.0 - 36.0 g/dL   RDW 40.9 81.1 - 91.4 %   Platelets 257 150 - 400 K/uL   nRBC 0.0 0.0 - 0.2 %  Type and screen [facility]   Collection Time: 08/25/22 10:04 PM  Result Value Ref Range   ABO/RH(D) O POS    Antibody Screen NEG    Sample Expiration      08/28/2022,2359 Performed at [facility], [address], [place], [place] [address]      Assessment:  [redacted]w[redacted]d SIUP  G2P1001  Latent labor  Cat 1 FHR  GBS Negative/-- (07/11 1214)  Plan:  Admit to L&D  IV pain meds/epidural prn active labor  Expectant managment  Anticipate vag delivery   Plans to breastfeed  Contraception:  unsure   Arabella Merles CNM 08/25/2022, 11:02 PM

## 2022-08-25 NOTE — Progress Notes (Signed)
S: Ms. Catherine Paul is a 27 y.o. G2P1001 at [redacted]w[redacted]d  who presents to MAU today complaining contractions q 7-10 minutes since early afternoon. She denies vaginal bleeding. She denies LOF. She reports normal fetal movement.    O: BP 127/85   Pulse 97   Temp 98 F (36.7 C)   Resp 18   Ht 5\' 4"  (1.626 m)   Wt 88.5 kg   LMP 11/28/2021   SpO2 99%   BMI 33.47 kg/m   Cervical exam:  Dilation: 2.5 Effacement (%): 50 Cervical Position: Posterior Station: Ballotable Exam by:: Henrine Screws RN   Fetal Monitoring: Baseline: 140 Variability: moderate Accelerations: present Decelerations: 1 deceleration noted in 2 hours of monitoring Contractions: contracting every 7 - 10 minutes  A: SIUP at [redacted]w[redacted]d  False labor  P: Patient without cervical change after 1.5 hours of contracting. Reactive NST. Okay for discharge home with return labor precautions reviewed.    Glee Arvin, MD 08/25/2022 6:14 PM

## 2022-08-25 NOTE — MAU Note (Signed)
.  Catherine Paul is a 27 y.o. at [redacted]w[redacted]d here in MAU reporting: ctx yesterday. This afternoon more frequent about 7-10 min and had some bloody show.  2.5 yesterday.  No leaking . Good fetal movement felt.  LMP:  Onset of complaint: yesterday Pain score: 5 Vitals:   08/25/22 1545  BP: 132/81  Pulse: 94  Resp: 18  Temp: 98 F (36.7 C)     FHT:150 Lab orders placed from triage:  labor eval

## 2022-08-26 ENCOUNTER — Inpatient Hospital Stay (HOSPITAL_COMMUNITY): Payer: BC Managed Care – PPO | Admitting: Anesthesiology

## 2022-08-26 ENCOUNTER — Encounter: Payer: BC Managed Care – PPO | Admitting: Obstetrics and Gynecology

## 2022-08-26 ENCOUNTER — Encounter (HOSPITAL_COMMUNITY): Payer: Self-pay | Admitting: Obstetrics and Gynecology

## 2022-08-26 DIAGNOSIS — O26893 Other specified pregnancy related conditions, third trimester: Secondary | ICD-10-CM | POA: Diagnosis present

## 2022-08-26 DIAGNOSIS — O2662 Liver and biliary tract disorders in childbirth: Secondary | ICD-10-CM | POA: Diagnosis not present

## 2022-08-26 DIAGNOSIS — Z3A38 38 weeks gestation of pregnancy: Secondary | ICD-10-CM | POA: Diagnosis not present

## 2022-08-26 MED ORDER — ONDANSETRON HCL 4 MG/2ML IJ SOLN: 4.0000 mg | Freq: Four times a day (QID) | INTRAMUSCULAR | Status: DC | PRN

## 2022-08-26 MED ORDER — SOD CITRATE-CITRIC ACID 500-334 MG/5ML PO SOLN: 30.0000 mL | ORAL | Status: DC | PRN

## 2022-08-26 MED ORDER — ACETAMINOPHEN 325 MG PO TABS: 650.0000 mg | ORAL_TABLET | ORAL | Status: DC | PRN

## 2022-08-26 MED ORDER — SIMETHICONE 80 MG PO CHEW: 80.0000 mg | CHEWABLE_TABLET | ORAL | Status: DC | PRN

## 2022-08-26 MED ORDER — EPHEDRINE 5 MG/ML INJ: 10.0000 mg | INTRAVENOUS | Status: DC | PRN

## 2022-08-26 MED ORDER — LACTATED RINGERS IV SOLN: INTRAVENOUS | Status: DC

## 2022-08-26 MED ORDER — FENTANYL CITRATE (PF) 100 MCG/2ML IJ SOLN
INTRAMUSCULAR | Status: DC | PRN
  Administered 2022-08-26: 15 ug via INTRATHECAL

## 2022-08-26 MED ORDER — BENZOCAINE-MENTHOL 20-0.5 % EX AERO
1.0000 | INHALATION_SPRAY | CUTANEOUS | Status: DC | PRN
  Administered 2022-08-26: 1 via TOPICAL
  Filled 2022-08-26: qty 56

## 2022-08-26 MED ORDER — ONDANSETRON HCL 4 MG PO TABS: 4.0000 mg | ORAL_TABLET | ORAL | Status: DC | PRN

## 2022-08-26 MED ORDER — SENNOSIDES-DOCUSATE SODIUM 8.6-50 MG PO TABS
2.0000 | ORAL_TABLET | ORAL | Status: DC
  Administered 2022-08-26 – 2022-08-27 (×2): 2 via ORAL
  Filled 2022-08-26 (×2): qty 2

## 2022-08-26 MED ORDER — OXYCODONE-ACETAMINOPHEN 5-325 MG PO TABS: 1.0000 | ORAL_TABLET | ORAL | Status: DC | PRN

## 2022-08-26 MED ORDER — PHENYLEPHRINE 80 MCG/ML (10ML) SYRINGE FOR IV PUSH (FOR BLOOD PRESSURE SUPPORT): 80.0000 ug | PREFILLED_SYRINGE | INTRAVENOUS | Status: DC | PRN

## 2022-08-26 MED ORDER — DIPHENHYDRAMINE HCL 25 MG PO CAPS: 25.0000 mg | ORAL_CAPSULE | Freq: Four times a day (QID) | ORAL | Status: DC | PRN

## 2022-08-26 MED ORDER — DIBUCAINE (PERIANAL) 1 % EX OINT: 1.0000 | TOPICAL_OINTMENT | CUTANEOUS | Status: DC | PRN

## 2022-08-26 MED ORDER — BUPIVACAINE HCL (PF) 0.25 % IJ SOLN
INTRAMUSCULAR | Status: DC | PRN
  Administered 2022-08-26: 1 mL via INTRATHECAL

## 2022-08-26 MED ORDER — OXYTOCIN-SODIUM CHLORIDE 30-0.9 UT/500ML-% IV SOLN
2.5000 [IU]/h | INTRAVENOUS | Status: DC
  Filled 2022-08-26: qty 500

## 2022-08-26 MED ORDER — WITCH HAZEL-GLYCERIN EX PADS: 1.0000 | MEDICATED_PAD | CUTANEOUS | Status: DC | PRN

## 2022-08-26 MED ORDER — DIPHENHYDRAMINE HCL 50 MG/ML IJ SOLN: 12.5000 mg | INTRAMUSCULAR | Status: DC | PRN

## 2022-08-26 MED ORDER — ONDANSETRON HCL 4 MG/2ML IJ SOLN: 4.0000 mg | INTRAMUSCULAR | Status: DC | PRN

## 2022-08-26 MED ORDER — OXYCODONE-ACETAMINOPHEN 5-325 MG PO TABS: 2.0000 | ORAL_TABLET | ORAL | Status: DC | PRN

## 2022-08-26 MED ORDER — OXYTOCIN BOLUS FROM INFUSION
333.0000 mL | Freq: Once | INTRAVENOUS | Status: DC
  Administered 2022-08-26: 333 mL via INTRAVENOUS

## 2022-08-26 MED ORDER — LACTATED RINGERS IV SOLN: 500.0000 mL | INTRAVENOUS | Status: DC | PRN

## 2022-08-26 MED ORDER — IBUPROFEN 600 MG PO TABS
600.0000 mg | ORAL_TABLET | Freq: Four times a day (QID) | ORAL | Status: DC
  Administered 2022-08-26 – 2022-08-27 (×6): 600 mg via ORAL
  Filled 2022-08-26 (×6): qty 1

## 2022-08-26 MED ORDER — ZOLPIDEM TARTRATE 5 MG PO TABS: 5.0000 mg | ORAL_TABLET | Freq: Every evening | ORAL | Status: DC | PRN

## 2022-08-26 MED ORDER — LIDOCAINE HCL (PF) 1 % IJ SOLN: 30.0000 mL | INTRAMUSCULAR | Status: DC | PRN

## 2022-08-26 MED ORDER — TETANUS-DIPHTH-ACELL PERTUSSIS 5-2.5-18.5 LF-MCG/0.5 IM SUSY
0.5000 mL | PREFILLED_SYRINGE | Freq: Once | INTRAMUSCULAR | Status: DC
  Filled 2022-08-26: qty 0.5

## 2022-08-26 MED ORDER — LACTATED RINGERS IV SOLN: 500.0000 mL | Freq: Once | INTRAVENOUS | Status: DC

## 2022-08-26 MED ORDER — FENTANYL-BUPIVACAINE-NACL 0.5-0.125-0.9 MG/250ML-% EP SOLN
12.0000 mL/h | EPIDURAL | Status: DC | PRN
  Administered 2022-08-26: 12 mL/h via EPIDURAL

## 2022-08-26 MED ORDER — FENTANYL-BUPIVACAINE-NACL 0.5-0.125-0.9 MG/250ML-% EP SOLN
EPIDURAL | Status: AC
  Filled 2022-08-26: qty 250

## 2022-08-26 MED ORDER — PRENATAL MULTIVITAMIN CH
1.0000 | ORAL_TABLET | Freq: Every day | ORAL | Status: DC
  Administered 2022-08-26 – 2022-08-27 (×2): 1 via ORAL
  Filled 2022-08-26 (×2): qty 1

## 2022-08-26 MED ORDER — COCONUT OIL OIL: 1.0000 | TOPICAL_OIL | Status: DC | PRN

## 2022-08-26 NOTE — Anesthesia Postprocedure Evaluation (Signed)
Anesthesia Post Note  Patient: Catherine Paul  Procedure(s) Performed: AN AD HOC LABOR EPIDURAL     Patient location during evaluation: Mother Baby Anesthesia Type: Epidural Level of consciousness: awake and alert Pain management: pain level controlled Vital Signs Assessment: post-procedure vital signs reviewed and stable Respiratory status: spontaneous breathing, nonlabored ventilation and respiratory function stable Cardiovascular status: stable Postop Assessment: no headache, no backache and epidural receding Anesthetic complications: no   No notable events documented.  Last Vitals:  Vitals:   08/26/22 0412 08/26/22 0513  BP: 116/75 111/68  Pulse: 87 94  Resp: 18 16  Temp: 36.9 C 36.4 C  SpO2: 98% 98%    Last Pain:  Vitals:   08/26/22 0752  TempSrc:   PainSc: 0-No pain   Pain Goal:                   [name]

## 2022-08-26 NOTE — Discharge Summary (Signed)
Postpartum Discharge Summary      Patient Name: Catherine Paul DOB: March 27, 1995 MRN: [id number]  Date of admission: 08/25/2022 Delivery date:08/26/2022 Delivering provider: Celedonio Savage Date of discharge: 08/27/2022  Admitting diagnosis: Indication for care in labor or delivery [O75.9] Intrauterine pregnancy: [redacted]w[redacted]d     Secondary diagnosis:  Principal Problem:   Indication for care in labor or delivery Active Problems:   Supervision of other normal pregnancy, antepartum   History of gestational hypertension   Vaginal delivery  Additional problems: None    Discharge diagnosis: Term Pregnancy Delivered                                              Post partum procedures: None Augmentation: N/A Complications: None  Hospital course: Onset of Labor With Vaginal Delivery      27 y.o. yo [id number] at [redacted]w[redacted]d was admitted in Active Labor on 08/25/2022. Labor course was uncomplicated. Membrane Rupture Time/Date:  ,   Delivery Method:Vaginal, Spontaneous Episiotomy: None Lacerations:  1st degree Patient had a uncomplicated postpartum course. She is ambulating, tolerating a regular diet, passing flatus, and urinating well. Patient is discharged home in stable condition on 08/27/22.  Newborn Data: Birth date:08/26/2022 Birth time:2:27 AM Gender:Female Living status:Living Apgars:9 ,9  Weight:3440 g  Magnesium Sulfate received: No BMZ received: No Rhophylac:N/A MMR:No T-DaP:Given prenatally Flu: N/A Transfusion:No  Physical exam  Vitals:   08/26/22 1402 08/26/22 1838 08/26/22 2048 08/27/22 0627  BP: 124/77 115/76 118/74 119/76  Pulse: 87 88 84 84  Resp: 18 18  18   Temp: 97.8 F (36.6 C) 98.3 F (36.8 C) 98.2 F (36.8 C) 98 F (36.7 C)  TempSrc: Oral Oral  Oral  SpO2: 99% 100%  97%   General: alert, cooperative, and no distress Lochia: appropriate Uterine Fundus: firm Incision: N/A DVT Evaluation: No evidence of DVT seen on physical exam. Labs: Lab  Results  Component Value Date   WBC 15.2 (H) 08/25/2022   HGB 12.9 08/25/2022   HCT 37.9 08/25/2022   MCV 87.9 08/25/2022   PLT 257 08/25/2022      Latest Ref Rng & Units 06/19/2022   12:04 AM  CMP  Glucose 70 - 99 mg/dL 474   BUN 6 - 20 mg/dL <5   Creatinine 2.59 - 1.00 mg/dL 5.63   Sodium 875 - 643 mmol/L 136   Potassium 3.5 - 5.1 mmol/L 3.5   Chloride 98 - 111 mmol/L 104   CO2 22 - 32 mmol/L 21   Calcium 8.9 - 10.3 mg/dL 8.9   Total Protein 6.5 - 8.1 g/dL 6.2   Total Bilirubin 0.3 - 1.2 mg/dL 0.5   Alkaline Phos 38 - 126 U/L 93   AST 15 - 41 U/L 13   ALT 0 - 44 U/L 10    Edinburgh Score:    08/26/2022    6:38 PM  Edinburgh Postnatal Depression Scale Screening Tool  I have been able to laugh and see the funny side of things. 0  I have looked forward with enjoyment to things. 0  I have blamed myself unnecessarily when things went wrong. 0  I have been anxious or worried for no good reason. 0  I have felt scared or panicky for no good reason. 0  Things have been getting on top of me. 0  I have been so unhappy  that I have had difficulty sleeping. 0  I have felt sad or miserable. 0  I have been so unhappy that I have been crying. 0  The thought of harming myself has occurred to me. 0  Edinburgh Postnatal Depression Scale Total 0     After visit meds:  Allergies as of 08/27/2022   No Known Allergies      Medication List     STOP taking these medications    aspirin EC 81 MG tablet       TAKE these medications    acetaminophen 325 MG tablet Commonly known as: Tylenol Take 2 tablets (650 mg total) by mouth every 4 (four) hours as needed (for pain scale < 4). What changed:  medication strength how much to take when to take this reasons to take this   cyclobenzaprine 5 MG tablet Commonly known as: FLEXERIL Take 1 tablet (5 mg total) by mouth 3 (three) times daily as needed for muscle spasms.   ibuprofen 600 MG tablet Commonly known as: ADVIL Take 1  tablet (600 mg total) by mouth every 6 (six) hours.   terconazole 0.4 % vaginal cream Commonly known as: TERAZOL 7 Place 1 applicator vaginally at bedtime. Use for seven days   Vitafol Ultra 29-0.6-0.4-200 MG Caps Take 1 capsule by mouth daily.         Discharge home in stable condition Infant Feeding: Breast Infant Disposition:home with mother Discharge instruction: per After Visit Summary and Postpartum booklet. Activity: Advance as tolerated. Pelvic rest for 6 weeks.  Diet: routine diet Future Appointments: Future Appointments  Date Time Provider Department Center  09/23/2022  9:55 AM Hermina Staggers, MD [facility] None   Follow up Visit:   Please schedule this patient for a In person postpartum visit in 4 weeks with the following provider: Any provider. Additional Postpartum F/U:PP pap smear  Low risk pregnancy complicated by:  None Delivery mode:  Vaginal, Spontaneous Anticipated Birth Control:  Unsure   08/27/2022 Celedonio Savage, MD

## 2022-08-26 NOTE — Anesthesia Preprocedure Evaluation (Addendum)
Anesthesia Evaluation  Patient identified by MRN, date of birth, ID band Patient awake    Reviewed: Allergy & Precautions, Patient's Chart, lab work & pertinent test results  History of Anesthesia Complications Negative for: history of anesthetic complications  Airway Mallampati: II  TM Distance: >3 FB Neck ROM: Full    Dental no notable dental hx.    Pulmonary neg pulmonary ROS   Pulmonary exam normal        Cardiovascular hypertension, Normal cardiovascular exam     Neuro/Psych negative neurological ROS  negative psych ROS   GI/Hepatic negative GI ROS, Neg liver ROS,,,  Endo/Other  negative endocrine ROS    Renal/GU negative Renal ROS  negative genitourinary   Musculoskeletal negative musculoskeletal ROS (+)    Abdominal   Peds  Hematology negative hematology ROS (+)   Anesthesia Other Findings Day of surgery medications reviewed with patient.  Reproductive/Obstetrics (+) Pregnancy                             Anesthesia Physical Anesthesia Plan  ASA: 2  Anesthesia Plan: Combined Spinal and Epidural   Post-op Pain Management:    Induction:   PONV Risk Score and Plan: Treatment may vary due to age or medical condition  Airway Management Planned: Natural Airway  Additional Equipment: Fetal Monitoring  Intra-op Plan:   Post-operative Plan:   Informed Consent: I have reviewed the patients History and Physical, chart, labs and discussed the procedure including the risks, benefits and alternatives for the proposed anesthesia with the patient or authorized representative who has indicated his/her understanding and acceptance.       Plan Discussed with:   Anesthesia Plan Comments: (Plan CSE d/t precipitous labor. Patient in agreement with plan. Stephannie Peters, MD)        Anesthesia Quick Evaluation

## 2022-08-26 NOTE — Anesthesia Procedure Notes (Signed)
Epidural Patient location during procedure: OB Start time: 08/26/2022 1:44 AM End time: 08/26/2022 1:47 AM  Staffing Anesthesiologist: Kaylyn Layer, MD Performed: anesthesiologist   Preanesthetic Checklist Completed: patient identified, IV checked, risks and benefits discussed, monitors and equipment checked, pre-op evaluation and timeout performed  Epidural Patient position: sitting Prep: DuraPrep and site prepped and draped Patient monitoring: heart rate, continuous pulse ox and blood pressure Approach: midline Location: L3-L4 Injection technique: LOR air  Needle:  Needle type: Tuohy  Needle gauge: 17 G Needle length: 9 cm Needle insertion depth: 5 cm Catheter type: closed end flexible Catheter size: 19 Gauge Catheter at skin depth: 10 cm  Assessment Events: blood not aspirated, injection not painful, no injection resistance, no paresthesia and negative IV test  Additional Notes Patient identified. Risks, benefits, and alternatives discussed with patient including but not limited to bleeding, infection, nerve damage, paralysis, failed block, incomplete pain control, headache, blood pressure changes, nausea, vomiting, reactions to medication, itching, and postpartum back pain. Confirmed with bedside nurse the patient's most recent platelet count. Confirmed with patient that they are not currently taking any anticoagulation, have any bleeding history, or any family history of bleeding disorders. Patient expressed understanding and wished to proceed. All questions were answered. Sterile technique was used throughout the entire procedure. Please see nursing notes for vital signs.   Crisp LOR with Tuohy needle on first pass. Whitacre 25g spinal needle introduced through Tuohy with clear CSF return prior to injection of intrathecal medication. Spinal needle withdrawn and epidural catheter threaded easily. Negative aspiration of catheter for heme or CSF prior to starting  epidural infusion. Warning signs of high block given to the patient including shortness of breath, tingling/numbness in hands, complete motor block, or any concerning symptoms with instructions to call for help. Patient was given instructions on fall risk and not to get out of bed. All questions and concerns addressed with instructions to call with any issues or inadequate analgesia.  Patient comfortable with contractions prior to my leaving room. Reason for block:procedure for pain

## 2022-08-26 NOTE — Lactation Note (Signed)
This note was copied from a baby's chart. Lactation Consultation Note  Patient Name: Catherine Paul [id number] Date: 08/26/2022 Age:26 hours Reason for consult: Initial assessment  P2, Baby started spitting up shortly after LC entered room. Attempt was made but baby did not latch.  Mother help baby upright skin to skin.  Mother able to hand express drops.Feed on demand with cues.  Goal 8-12+ times per day after first 24 hrs.  Place baby STS if not cueing.  Mom made aware of O/P services, breastfeeding support group and our phone # for post-discharge questions.     Maternal Data Has patient been taught Hand Expression?: Yes Does the patient have breastfeeding experience prior to this delivery?: Yes How long did the patient breastfeed?:  (Breastfed for one month and then pumped for 8 mos.)  Feeding Mother's Current Feeding Choice: Breast Milk  Interventions Interventions: Breast feeding basics reviewed;Education;LC Services brochure  Discharge    Consult Status Consult Status: Follow-up Date: 08/27/22 Follow-up type: In-patient    [name] [facility] 08/26/2022, 8:46 AM

## 2022-08-26 NOTE — Lactation Note (Signed)
This note was copied from a baby's chart. Lactation Consultation Note  Patient Name: Catherine Paul [id number] Date: 08/26/2022 Age:26 hours  Attempted to see mom but everyone sleeping.   Maternal Data    Feeding    LATCH Score                    Lactation Tools Discussed/Used    Interventions    Discharge    Consult Status      Charyl Dancer 08/26/2022, 6:19 AM

## 2022-08-27 ENCOUNTER — Other Ambulatory Visit (HOSPITAL_COMMUNITY): Payer: Self-pay

## 2022-08-27 ENCOUNTER — Other Ambulatory Visit: Payer: Self-pay

## 2022-08-27 LAB — BIRTH TISSUE RECOVERY COLLECTION (PLACENTA DONATION)

## 2022-08-27 MED ORDER — ACETAMINOPHEN 325 MG PO TABS
650.0000 mg | ORAL_TABLET | ORAL | 0 refills | Status: AC | PRN
Start: 2022-08-27 — End: ?
  Filled 2022-08-27: qty 100, 9d supply, fill #0

## 2022-08-27 MED ORDER — IBUPROFEN 600 MG PO TABS
600.0000 mg | ORAL_TABLET | Freq: Four times a day (QID) | ORAL | 0 refills | Status: AC
  Filled 2022-08-27: qty 30, 8d supply, fill #0

## 2022-08-27 NOTE — Discharge Instructions (Signed)
WHAT TO LOOK OUT FOR: Fever of 100.4 or above Mastitis: feels like flu and breasts hurt Infection: increased pain, swelling or redness Blood clots golf ball size or larger Postpartum depression   Congratulations on your newest addition!WHAT TO LOOK OUT FOR: Fever of 100.4 or above Mastitis: feels like flu and breasts hurt Infection: increased pain, swelling or redness Blood clots golf ball size or larger Postpartum depression   Congratulations on your newest addition! 

## 2022-08-27 NOTE — Plan of Care (Signed)
  Problem: Education: Goal: Knowledge of Childbirth will improve Outcome: Adequate for Discharge Goal: Ability to make informed decisions regarding treatment and plan of care will improve Outcome: Adequate for Discharge Goal: Ability to state and carry out methods to decrease the pain will improve Outcome: Adequate for Discharge Goal: Individualized Educational Video(s) Outcome: Adequate for Discharge   Problem: Coping: Goal: Ability to verbalize concerns and feelings about labor and delivery will improve Outcome: Adequate for Discharge   Problem: Life Cycle: Goal: Ability to make normal progression through stages of labor will improve Outcome: Adequate for Discharge Goal: Ability to effectively push during vaginal delivery will improve Outcome: Adequate for Discharge   Problem: Role Relationship: Goal: Will demonstrate positive interactions with the child Outcome: Adequate for Discharge   Problem: Safety: Goal: Risk of complications during labor and delivery will decrease Outcome: Adequate for Discharge   Problem: Pain Management: Goal: Relief or control of pain from uterine contractions will improve Outcome: Adequate for Discharge   Problem: Education: Goal: Knowledge of General Education information will improve Description: Including pain rating scale, medication(s)/side effects and non-pharmacologic comfort measures Outcome: Adequate for Discharge   Problem: Health Behavior/Discharge Planning: Goal: Ability to manage health-related needs will improve Outcome: Adequate for Discharge   Problem: Clinical Measurements: Goal: Ability to maintain clinical measurements within normal limits will improve Outcome: Adequate for Discharge Goal: Will remain free from infection Outcome: Adequate for Discharge Goal: Diagnostic test results will improve Outcome: Adequate for Discharge Goal: Respiratory complications will improve Outcome: Adequate for Discharge Goal:  Cardiovascular complication will be avoided Outcome: Adequate for Discharge   Problem: Activity: Goal: Risk for activity intolerance will decrease Outcome: Adequate for Discharge   Problem: Nutrition: Goal: Adequate nutrition will be maintained Outcome: Adequate for Discharge   Problem: Coping: Goal: Level of anxiety will decrease Outcome: Adequate for Discharge   Problem: Elimination: Goal: Will not experience complications related to bowel motility Outcome: Adequate for Discharge Goal: Will not experience complications related to urinary retention Outcome: Adequate for Discharge   Problem: Pain Managment: Goal: General experience of comfort will improve Outcome: Adequate for Discharge   Problem: Safety: Goal: Ability to remain free from injury will improve Outcome: Adequate for Discharge   Problem: Skin Integrity: Goal: Risk for impaired skin integrity will decrease Outcome: Adequate for Discharge   Problem: Education: Goal: Knowledge of condition will improve Outcome: Adequate for Discharge Goal: Individualized Educational Video(s) Outcome: Adequate for Discharge Goal: Individualized Newborn Educational Video(s) Outcome: Adequate for Discharge   Problem: Activity: Goal: Will verbalize the importance of balancing activity with adequate rest periods Outcome: Adequate for Discharge Goal: Ability to tolerate increased activity will improve Outcome: Adequate for Discharge   Problem: Coping: Goal: Ability to identify and utilize available resources and services will improve Outcome: Adequate for Discharge   Problem: Life Cycle: Goal: Chance of risk for complications during the postpartum period will decrease Outcome: Adequate for Discharge   Problem: Role Relationship: Goal: Ability to demonstrate positive interaction with newborn will improve Outcome: Adequate for Discharge   Problem: Skin Integrity: Goal: Demonstration of wound healing without infection will  improve Outcome: Adequate for Discharge   

## 2022-09-02 ENCOUNTER — Encounter: Payer: BC Managed Care – PPO | Admitting: Obstetrics and Gynecology

## 2022-09-04 ENCOUNTER — Inpatient Hospital Stay (HOSPITAL_COMMUNITY)
Admission: AD | Admit: 2022-09-04 | Payer: BC Managed Care – PPO | Source: Home / Self Care | Admitting: Obstetrics & Gynecology

## 2022-09-10 ENCOUNTER — Encounter: Payer: BC Managed Care – PPO | Admitting: Obstetrics and Gynecology

## 2022-09-21 ENCOUNTER — Telehealth (HOSPITAL_COMMUNITY): Payer: Self-pay | Admitting: *Deleted

## 2022-09-21 NOTE — Telephone Encounter (Signed)
09/21/2022  Name: Catherine Paul MRN: [id number] DOB: 03/27/1995  Reason for Call:  Transition of Care Hospital Discharge Call  Contact Status: Patient Contact Status: Message  Language assistant needed:          Follow-Up Questions:    Inocente Salles Postnatal Depression Scale:  In the Past 7 Days:    PHQ2-9 Depression Scale:     Discharge Follow-up:    Post-discharge interventions: NA  Salena Saner, RN 09/21/2022 16:03

## 2022-09-23 ENCOUNTER — Ambulatory Visit: Payer: BC Managed Care – PPO | Admitting: Obstetrics and Gynecology
